# Patient Record
Sex: Female | Born: 1980 | Race: Asian | Hispanic: No | Marital: Single | Smoking: Never smoker
Health system: Southern US, Community
[De-identification: ages and names within clinical notes are randomized; demographics above are authoritative.]

---

## 2004-04-10 ENCOUNTER — Ambulatory Visit: Payer: Self-pay | Admitting: Family Medicine

## 2004-07-16 ENCOUNTER — Other Ambulatory Visit: Admission: RE | Admit: 2004-07-16 | Discharge: 2004-07-16 | Payer: Self-pay | Admitting: Family Medicine

## 2004-07-16 ENCOUNTER — Ambulatory Visit: Payer: Self-pay | Admitting: Family Medicine

## 2004-12-02 ENCOUNTER — Ambulatory Visit: Payer: Self-pay | Admitting: Family Medicine

## 2005-07-02 ENCOUNTER — Encounter: Payer: Self-pay | Admitting: Family Medicine

## 2005-07-02 ENCOUNTER — Ambulatory Visit: Payer: Self-pay | Admitting: Family Medicine

## 2005-07-05 ENCOUNTER — Other Ambulatory Visit: Admission: RE | Admit: 2005-07-05 | Discharge: 2005-07-05 | Payer: Self-pay | Admitting: Family Medicine

## 2006-08-10 ENCOUNTER — Encounter: Payer: Self-pay | Admitting: Family Medicine

## 2006-08-10 ENCOUNTER — Other Ambulatory Visit: Admission: RE | Admit: 2006-08-10 | Discharge: 2006-08-10 | Payer: Self-pay | Admitting: Family Medicine

## 2006-08-10 ENCOUNTER — Ambulatory Visit: Payer: Self-pay | Admitting: Family Medicine

## 2006-08-25 ENCOUNTER — Ambulatory Visit: Payer: Self-pay | Admitting: Family Medicine

## 2006-11-03 ENCOUNTER — Ambulatory Visit: Payer: Self-pay | Admitting: Family Medicine

## 2006-11-21 ENCOUNTER — Ambulatory Visit: Payer: Self-pay | Admitting: Family Medicine

## 2006-11-21 DIAGNOSIS — N760 Acute vaginitis: Secondary | ICD-10-CM | POA: Insufficient documentation

## 2007-02-06 ENCOUNTER — Ambulatory Visit: Payer: Self-pay | Admitting: Family Medicine

## 2007-02-06 DIAGNOSIS — J209 Acute bronchitis, unspecified: Secondary | ICD-10-CM | POA: Insufficient documentation

## 2007-03-23 ENCOUNTER — Ambulatory Visit: Payer: Self-pay | Admitting: Family Medicine

## 2010-06-15 ENCOUNTER — Encounter
Admission: RE | Admit: 2010-06-15 | Discharge: 2010-06-15 | Payer: Self-pay | Source: Home / Self Care | Attending: Family Medicine | Admitting: Family Medicine

## 2010-06-17 ENCOUNTER — Encounter: Payer: Self-pay | Admitting: Family Medicine

## 2011-04-20 ENCOUNTER — Other Ambulatory Visit: Payer: Self-pay | Admitting: Obstetrics and Gynecology

## 2012-04-29 IMAGING — CT CT HEAD W/O CM
2 series · 16 of 30 positions shown, 20 images · non-contrast
Comparison: None.

CLINICAL DATA: Headaches with increasing frequency and severity.
Occasional nausea and vomiting with sound and light sensitivity.

CT HEAD WITHOUT CONTRAST
TECHNIQUE: Contiguous axial images were obtained from the base of
the skull through the vertex without contrast.

[Series 2: head w/o · axial · non-contrast · 0.49mm/px · z∈[+36,+160]mm · 13 of 28 slices shown, 17 images]
[im 2/28  brain]
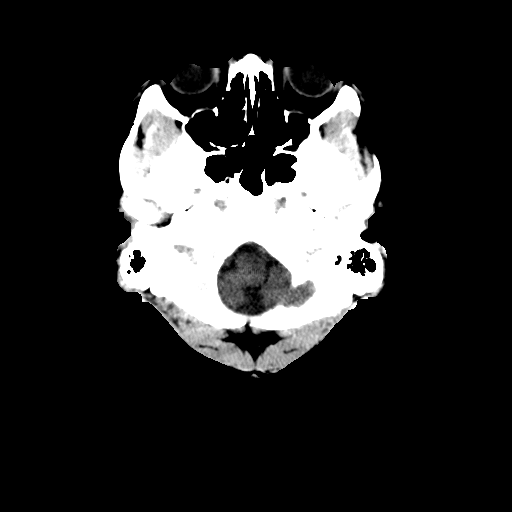
[im 2/28  bone]
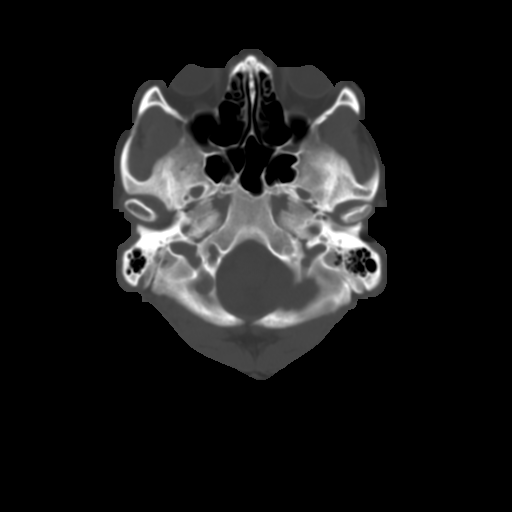
[im 4/28  brain]
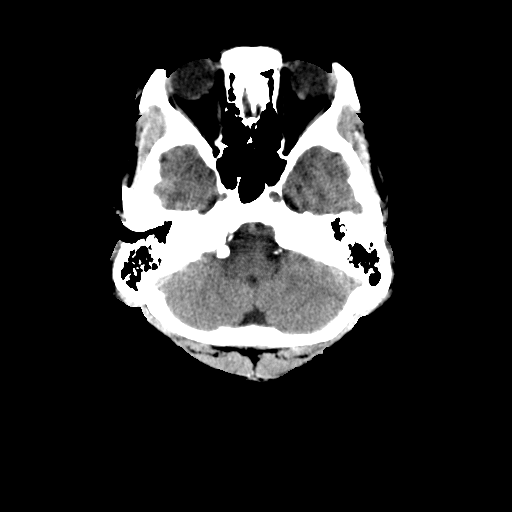
[im 6/28  brain]
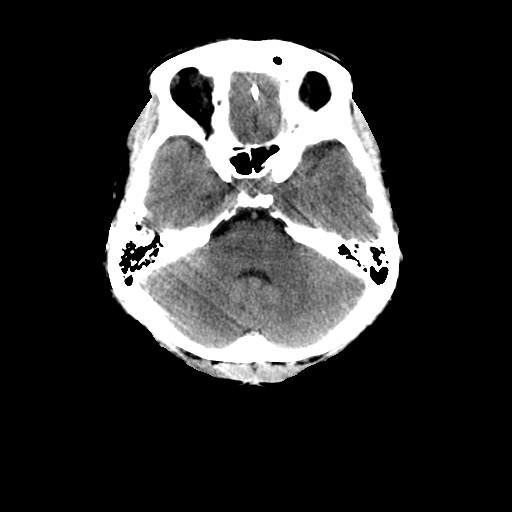
[im 8/28  brain]
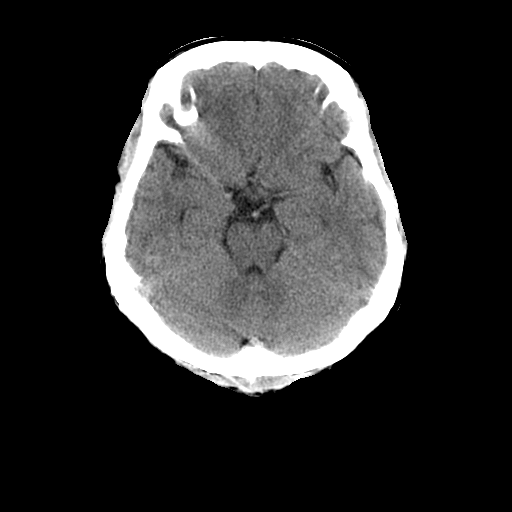
[im 10/28  brain]
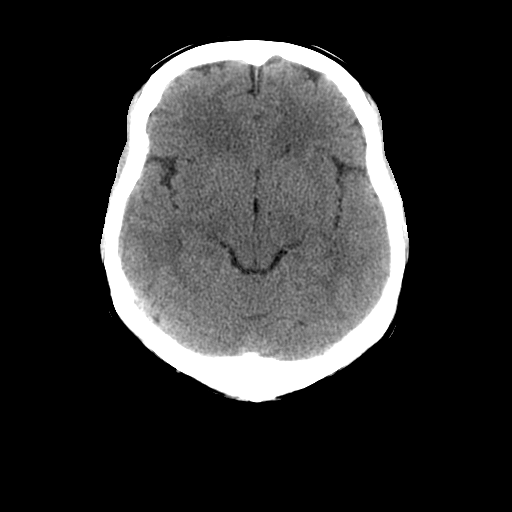
[im 10/28  bone]
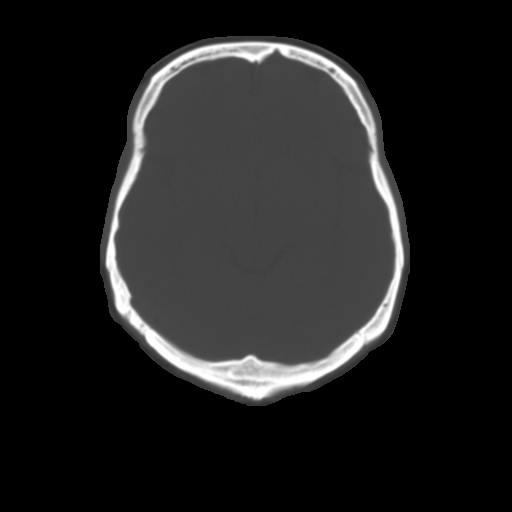
[im 12/28  brain]
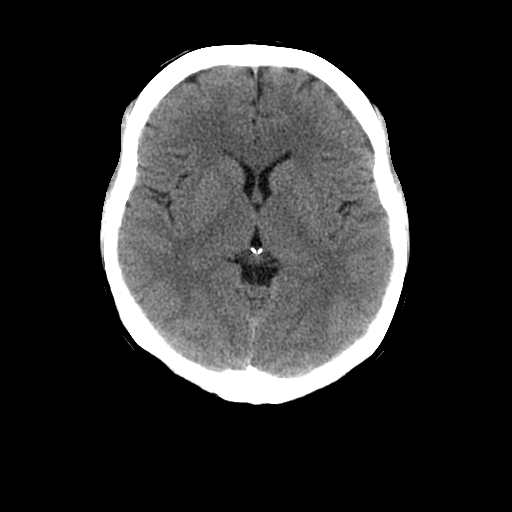
[im 14/28  brain]
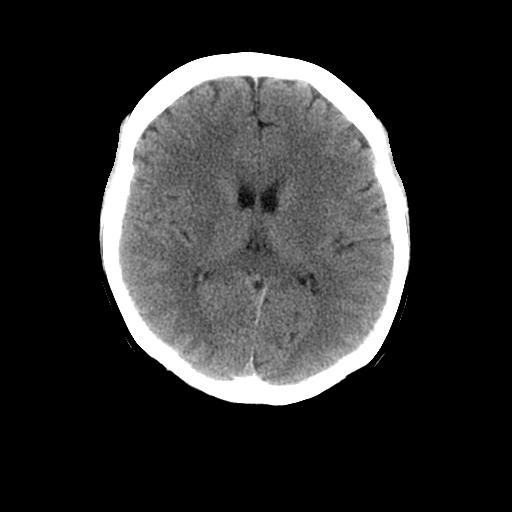
[im 16/28  brain]
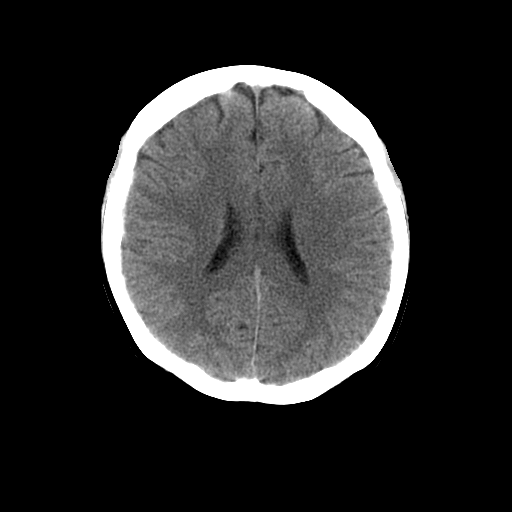
[im 18/28  brain]
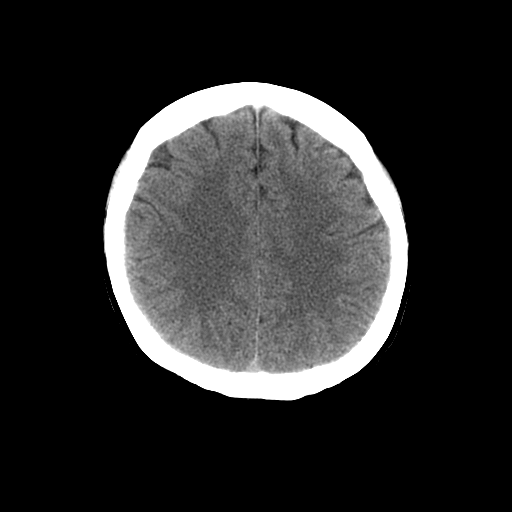
[im 18/28  bone]
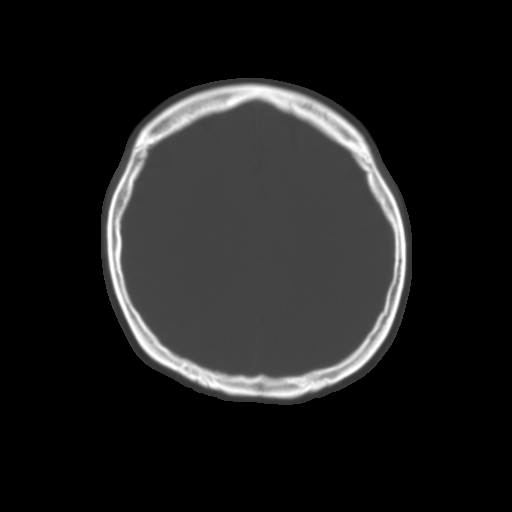
[im 20/28  brain]
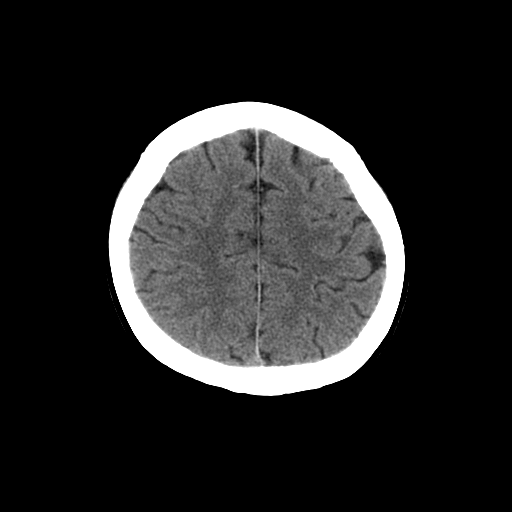
[im 22/28  brain]
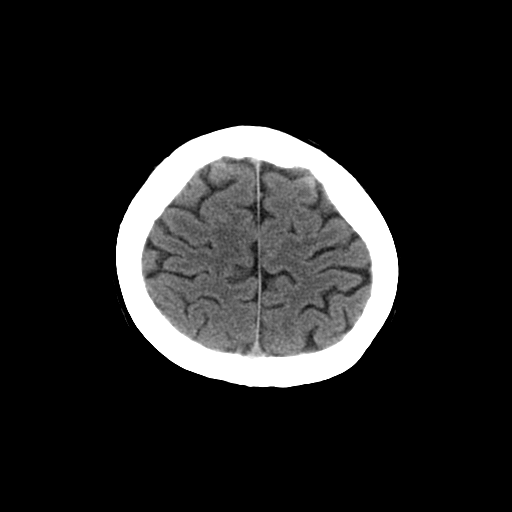
[im 24/28  brain]
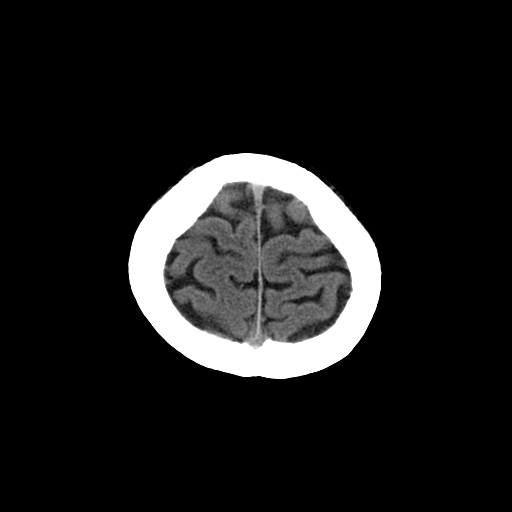
[im 26/28  brain]
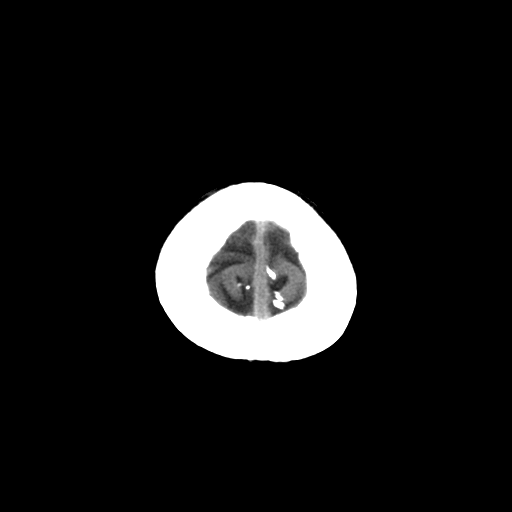
[im 26/28  bone]
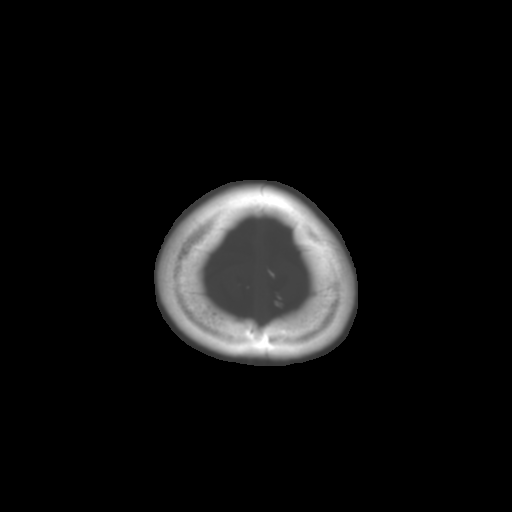

[Series 3: head bone · axial · 0.49mm/px · z∈[+36,+77]mm · 3 of 28 slices shown]
[im 2/28  bone]
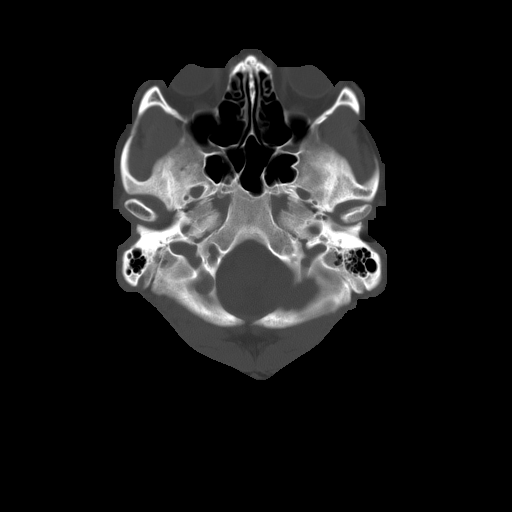
[im 6/28  bone]
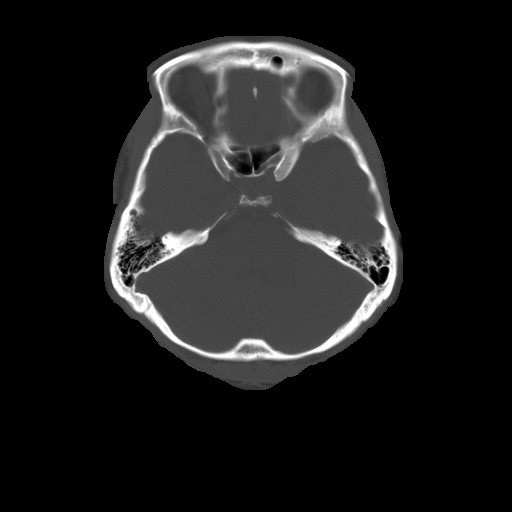
[im 10/28  bone]
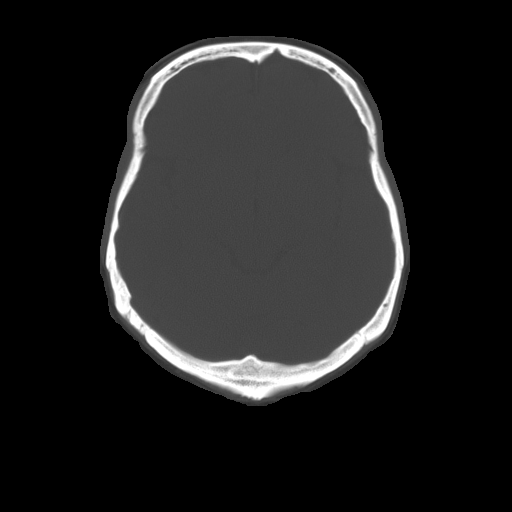

[16 of 30 positions shown; findings below may reference images not displayed]

FINDINGS: No evidence of acute infarct, acute hemorrhage, mass
lesion, mass effect or hydrocephalus.  Visualized portions of the
paranasal sinuses and mastoid air cells are clear.
IMPRESSION: No acute findings.

## 2016-04-20 LAB — OB RESULTS CONSOLE HEPATITIS B SURFACE ANTIGEN: HEP B S AG: NEGATIVE

## 2016-04-20 LAB — OB RESULTS CONSOLE ANTIBODY SCREEN: ANTIBODY SCREEN: NEGATIVE

## 2016-04-20 LAB — OB RESULTS CONSOLE HIV ANTIBODY (ROUTINE TESTING): HIV: NONREACTIVE

## 2016-04-20 LAB — OB RESULTS CONSOLE RPR: RPR: NONREACTIVE

## 2016-04-20 LAB — OB RESULTS CONSOLE GC/CHLAMYDIA
Chlamydia: NEGATIVE
Gonorrhea: NEGATIVE

## 2016-04-20 LAB — OB RESULTS CONSOLE ABO/RH: RH TYPE: POSITIVE

## 2016-04-20 LAB — OB RESULTS CONSOLE RUBELLA ANTIBODY, IGM: Rubella: IMMUNE

## 2016-05-07 ENCOUNTER — Other Ambulatory Visit: Payer: Self-pay | Admitting: Obstetrics and Gynecology

## 2016-05-07 ENCOUNTER — Other Ambulatory Visit (HOSPITAL_COMMUNITY)
Admission: RE | Admit: 2016-05-07 | Discharge: 2016-05-07 | Disposition: A | Discharge status: Home / Self Care | Payer: BLUE CROSS/BLUE SHIELD | Source: Ambulatory Visit | Attending: Obstetrics and Gynecology | Admitting: Obstetrics and Gynecology

## 2016-05-07 DIAGNOSIS — Z113 Encounter for screening for infections with a predominantly sexual mode of transmission: Secondary | ICD-10-CM | POA: Diagnosis present

## 2016-05-07 DIAGNOSIS — Z1151 Encounter for screening for human papillomavirus (HPV): Secondary | ICD-10-CM | POA: Diagnosis present

## 2016-05-07 DIAGNOSIS — Z01419 Encounter for gynecological examination (general) (routine) without abnormal findings: Secondary | ICD-10-CM | POA: Insufficient documentation

## 2016-05-12 LAB — CYTOLOGY - PAP
CHLAMYDIA, DNA PROBE: NEGATIVE
Diagnosis: NEGATIVE
HPV (WINDOPATH): NOT DETECTED
NEISSERIA GONORRHEA: NEGATIVE

## 2016-10-27 LAB — OB RESULTS CONSOLE GBS: GBS: NEGATIVE

## 2016-11-23 ENCOUNTER — Telehealth (HOSPITAL_COMMUNITY): Payer: Self-pay | Admitting: *Deleted

## 2016-11-23 ENCOUNTER — Encounter (HOSPITAL_COMMUNITY): Payer: Self-pay | Admitting: *Deleted

## 2016-11-23 NOTE — Telephone Encounter (Signed)
Preadmission screen  

## 2016-11-27 ENCOUNTER — Other Ambulatory Visit: Payer: Self-pay | Admitting: Obstetrics and Gynecology

## 2016-11-29 ENCOUNTER — Inpatient Hospital Stay (HOSPITAL_COMMUNITY)
Admission: RE | Admit: 2016-11-29 | Discharge: 2016-12-04 | DRG: 766 | Disposition: A | Discharge status: Home / Self Care | Payer: BLUE CROSS/BLUE SHIELD | Source: Ambulatory Visit | Attending: Obstetrics and Gynecology | Admitting: Obstetrics and Gynecology

## 2016-11-29 ENCOUNTER — Encounter (HOSPITAL_COMMUNITY): Payer: Self-pay

## 2016-11-29 VITALS — BP 114/80 | HR 57 | Temp 98.5°F | Resp 16 | Ht 67.0 in | Wt 173.0 lb

## 2016-11-29 DIAGNOSIS — O43123 Velamentous insertion of umbilical cord, third trimester: Secondary | ICD-10-CM | POA: Diagnosis present

## 2016-11-29 DIAGNOSIS — O894 Spinal and epidural anesthesia-induced headache during the puerperium: Secondary | ICD-10-CM | POA: Diagnosis not present

## 2016-11-29 DIAGNOSIS — O480 Post-term pregnancy: Secondary | ICD-10-CM | POA: Diagnosis present

## 2016-11-29 DIAGNOSIS — Z3A40 40 weeks gestation of pregnancy: Secondary | ICD-10-CM | POA: Diagnosis not present

## 2016-11-29 DIAGNOSIS — Z98891 History of uterine scar from previous surgery: Secondary | ICD-10-CM

## 2016-11-29 LAB — CBC
HEMATOCRIT: 36.7 % (ref 36.0–46.0)
HEMOGLOBIN: 12.7 g/dL (ref 12.0–15.0)
MCH: 29.8 pg (ref 26.0–34.0)
MCHC: 34.6 g/dL (ref 30.0–36.0)
MCV: 86.2 fL (ref 78.0–100.0)
Platelets: 192 10*3/uL (ref 150–400)
RBC: 4.26 MIL/uL (ref 3.87–5.11)
RDW: 14.7 % (ref 11.5–15.5)
WBC: 8.7 10*3/uL (ref 4.0–10.5)

## 2016-11-29 LAB — ABO/RH: ABO/RH(D): B POS

## 2016-11-29 LAB — TYPE AND SCREEN
ABO/RH(D): B POS
ANTIBODY SCREEN: NEGATIVE

## 2016-11-29 MED ORDER — SOD CITRATE-CITRIC ACID 500-334 MG/5ML PO SOLN
30.0000 mL | ORAL | Status: DC | PRN
  Administered 2016-11-30: 30 mL via ORAL
  Filled 2016-11-29: qty 15

## 2016-11-29 MED ORDER — BUTORPHANOL TARTRATE 1 MG/ML IJ SOLN
1.0000 mg | INTRAMUSCULAR | Status: DC | PRN
  Administered 2016-11-30: 1 mg via INTRAVENOUS
  Filled 2016-11-29: qty 1

## 2016-11-29 MED ORDER — OXYCODONE-ACETAMINOPHEN 5-325 MG PO TABS: 1.0000 | ORAL_TABLET | ORAL | Status: DC | PRN

## 2016-11-29 MED ORDER — ZOLPIDEM TARTRATE 5 MG PO TABS: 5.0000 mg | ORAL_TABLET | Freq: Every evening | ORAL | Status: DC | PRN

## 2016-11-29 MED ORDER — OXYTOCIN BOLUS FROM INFUSION: 500.0000 mL | Freq: Once | INTRAVENOUS | Status: DC

## 2016-11-29 MED ORDER — MISOPROSTOL 25 MCG QUARTER TABLET
25.0000 ug | ORAL_TABLET | ORAL | Status: DC | PRN
  Administered 2016-11-29 (×4): 25 ug via VAGINAL
  Filled 2016-11-29 (×6): qty 1

## 2016-11-29 MED ORDER — OXYCODONE-ACETAMINOPHEN 5-325 MG PO TABS: 2.0000 | ORAL_TABLET | ORAL | Status: DC | PRN

## 2016-11-29 MED ORDER — LIDOCAINE HCL (PF) 1 % IJ SOLN: 30.0000 mL | INTRAMUSCULAR | Status: DC | PRN

## 2016-11-29 MED ORDER — TERBUTALINE SULFATE 1 MG/ML IJ SOLN: 0.2500 mg | Freq: Once | INTRAMUSCULAR | Status: DC | PRN

## 2016-11-29 MED ORDER — LACTATED RINGERS IV SOLN
INTRAVENOUS | Status: DC
  Administered 2016-11-29 (×2): via INTRAVENOUS
  Administered 2016-11-30: 125 mL/h via INTRAVENOUS
  Administered 2016-11-30: 06:00:00 via INTRAVENOUS

## 2016-11-29 MED ORDER — OXYTOCIN 40 UNITS IN LACTATED RINGERS INFUSION - SIMPLE MED
2.5000 [IU]/h | INTRAVENOUS | Status: DC
  Filled 2016-11-29: qty 1000

## 2016-11-29 MED ORDER — ONDANSETRON HCL 4 MG/2ML IJ SOLN: 4.0000 mg | Freq: Four times a day (QID) | INTRAMUSCULAR | Status: DC | PRN

## 2016-11-29 MED ORDER — LACTATED RINGERS IV SOLN: 500.0000 mL | INTRAVENOUS | Status: DC | PRN

## 2016-11-29 MED ORDER — ACETAMINOPHEN 325 MG PO TABS: 650.0000 mg | ORAL_TABLET | ORAL | Status: DC | PRN

## 2016-11-29 NOTE — Progress Notes (Signed)
Felicia Dawson is a 36 y.o. G1P0 at 4769w4d admitted for induction of labor due to Post dates. Due date 11/25/2016.  Subjective: Patient reports regular contractions more painful. She reports bloody show.   Objective: BP 122/82   Pulse 63   Temp 97.9 F (36.6 C) (Oral)   Resp 16   Ht 5\' 7"  (1.702 m)   Wt 78.5 kg (173 lb)   LMP 02/19/2016   BMI 27.10 kg/m  No intake/output data recorded. No intake/output data recorded.  FHT:  FHR: 140 bpm, variability: moderate,  accelerations:  Present,  decelerations:  Absent UC:   regular, every 2 minutes SVE:   Dilation: 2 Effacement (%): 50 Station: -2 Exam by:: Dr Richardson Doppole  Labs: Lab Results  Component Value Date   WBC 8.7 11/29/2016   HGB 12.7 11/29/2016   HCT 36.7 11/29/2016   MCV 86.2 11/29/2016   PLT 192 11/29/2016    Assessment / Plan: 40 wks and 4 days for induction. S/p cytotec with regular contractions. Pt would like to avoid pitocin if possible. Given that contractions are regular and more painful. Recheck cervix in 4 hour. Plan AROM with fetal descent.   Preeclampsia:  NA Fetal Wellbeing:  Category I Pain Control:  Labor support without medications I/D:  n/a Anticipated MOD:  NSVD  Steven Veazie J. 11/29/2016, 6:28 PM

## 2016-11-29 NOTE — Anesthesia Pain Management Evaluation Note (Signed)
  CRNA Pain Management Visit Note  Patient: Felicia Dawson, 36 y.o., female  "Hello I am a member of the anesthesia team at [facility]. We have an anesthesia team available at all times to provide care throughout the hospital, including epidural management and anesthesia for C-section. I don't know your plan for the delivery whether it a natural birth, water birth, IV sedation, nitrous supplementation, doula or epidural, but we want to meet your pain goals."   1.Was your pain managed to your expectations on prior hospitalizations?   No prior hospitalizations  2.What is your expectation for pain management during this hospitalization?     Epidural  3.How can we help you reach that goal? Natural, then IV pain meds and epidural when progressed further  Record the patient's initial score and the patient's pain goal.   Pain: 0  Pain Goal: 4 [facility] wants you to be able to say your pain was always managed very well.  Felicia Dawson 11/29/2016

## 2016-11-29 NOTE — H&P (Signed)
Felicia Dawson is a 36 y.o. female G1P0 at 40 wks and 4 days  presenting for induction of labor due to postdates. EDD July 04/2017. Pregnancy has been uncomplicated. Prenatal care provided by Dr. Gerald Leitzara Kim Oki with [facility].    OB History    Gravida Para Term Preterm AB Living   1             SAB TAB Ectopic Multiple Live Births                 History reviewed. No pertinent past medical history. History reviewed. No pertinent surgical history. Family History: family history includes Depression in her paternal uncle; Diabetes in her maternal grandmother; Heart disease in her maternal grandmother; Hypertension in her maternal grandmother. Social History:  reports that she has never smoked. She has never used smokeless tobacco. Her alcohol and drug histories are not on file.     Maternal Diabetes: No Genetic Screening: Normal Maternal Ultrasounds/Referrals: Normal Fetal Ultrasounds or other Referrals:  None Maternal Substance Abuse:  No Significant Maternal Medications:  None Significant Maternal Lab Results:  Lab values include: Group B Strep negative Other Comments:  None  Review of Systems  Constitutional: Negative.   HENT: Negative.   Eyes: Negative.   Respiratory: Negative.   Cardiovascular: Negative.   Gastrointestinal: Negative.   Genitourinary: Negative.   Musculoskeletal: Negative.   Skin: Negative.   Neurological: Negative.   Endo/Heme/Allergies: Negative.   Psychiatric/Behavioral: Negative.    History Dilation: Closed Effacement (%): Thick Station: -2 Exam by:: erin davis rn Blood pressure 112/75, pulse 78, temperature 97.9 F (36.6 C), temperature source Oral, resp. rate 20, height 5\' 7"  (1.702 m), weight 78.5 kg (173 lb), last menstrual period 02/19/2016. Maternal Exam:  Uterine Assessment: Contraction strength is mild.  Contraction frequency is irregular.   Abdomen: Patient reports no abdominal tenderness. Introitus: Normal vulva. Normal vagina.  Pelvis:  adequate for delivery.      Fetal Exam Fetal Monitor Review: Baseline rate: 140.  Variability: moderate (6-25 bpm).   Pattern: accelerations present and no decelerations.    Fetal State Assessment: Category I - tracings are normal.     Physical Exam  Vitals reviewed. Constitutional: She is oriented to person, place, and time. She appears well-developed and well-nourished.  HENT:  Head: Normocephalic and atraumatic.  Eyes: Pupils are equal, round, and reactive to light. Conjunctivae are normal.  Neck: Normal range of motion. Neck supple.  Cardiovascular: Normal rate and regular rhythm.   Respiratory: Effort normal and breath sounds normal.  GI: Soft. Bowel sounds are normal. There is no tenderness.  Genitourinary: Vagina normal.  Musculoskeletal: Normal range of motion. She exhibits no edema.  Neurological: She is alert and oriented to person, place, and time. She has normal reflexes.  Skin: Skin is warm and dry.  Psychiatric: She has a normal mood and affect.    Prenatal labs: ABO, Rh: --/--/B POS (07/16 0110) Antibody: NEG (07/16 0110) Rubella: Immune (12/05 0000) RPR: Nonreactive (12/05 0000)  HBsAg: Negative (12/05 0000)  HIV: Non-reactive (12/05 0000)  GBS: Negative (06/13 0000)   Assessment/Plan: 40 wks and 4 days Postdates for induction. Pt informed of increased r/o Cesarean section associated with induction. She voiced understanding and request to proceed with induction.  Cytotec for cervical ripening.    Esparanza Krider J. 11/29/2016, 8:36 AM

## 2016-11-30 ENCOUNTER — Encounter (HOSPITAL_COMMUNITY)
Admission: RE | Disposition: A | Discharge status: Home / Self Care | Payer: Self-pay | Source: Ambulatory Visit | Attending: Obstetrics and Gynecology

## 2016-11-30 ENCOUNTER — Inpatient Hospital Stay (HOSPITAL_COMMUNITY): Payer: BLUE CROSS/BLUE SHIELD | Admitting: Anesthesiology

## 2016-11-30 LAB — RPR: RPR Ser Ql: NONREACTIVE

## 2016-11-30 SURGERY — Surgical Case
Anesthesia: Epidural

## 2016-11-30 MED ORDER — DIPHENHYDRAMINE HCL 25 MG PO CAPS: 25.0000 mg | ORAL_CAPSULE | Freq: Four times a day (QID) | ORAL | Status: DC | PRN

## 2016-11-30 MED ORDER — ZOLPIDEM TARTRATE 5 MG PO TABS: 5.0000 mg | ORAL_TABLET | Freq: Every evening | ORAL | Status: DC | PRN

## 2016-11-30 MED ORDER — CEFAZOLIN SODIUM-DEXTROSE 2-3 GM-% IV SOLR
INTRAVENOUS | Status: DC | PRN
  Administered 2016-11-30: 2 g via INTRAVENOUS

## 2016-11-30 MED ORDER — MENTHOL 3 MG MT LOZG: 1.0000 | LOZENGE | OROMUCOSAL | Status: DC | PRN

## 2016-11-30 MED ORDER — FENTANYL CITRATE (PF) 100 MCG/2ML IJ SOLN
INTRAMUSCULAR | Status: AC
  Filled 2016-11-30: qty 2

## 2016-11-30 MED ORDER — MORPHINE SULFATE (PF) 0.5 MG/ML IJ SOLN
INTRAMUSCULAR | Status: AC
  Filled 2016-11-30: qty 10

## 2016-11-30 MED ORDER — IBUPROFEN 600 MG PO TABS
600.0000 mg | ORAL_TABLET | Freq: Four times a day (QID) | ORAL | Status: DC
  Administered 2016-12-01 – 2016-12-04 (×13): 600 mg via ORAL
  Filled 2016-11-30 (×14): qty 1

## 2016-11-30 MED ORDER — SENNOSIDES-DOCUSATE SODIUM 8.6-50 MG PO TABS
2.0000 | ORAL_TABLET | ORAL | Status: DC
  Administered 2016-12-02 – 2016-12-03 (×3): 2 via ORAL
  Filled 2016-11-30 (×3): qty 2

## 2016-11-30 MED ORDER — TETANUS-DIPHTH-ACELL PERTUSSIS 5-2.5-18.5 LF-MCG/0.5 IM SUSP: 0.5000 mL | Freq: Once | INTRAMUSCULAR | Status: DC

## 2016-11-30 MED ORDER — EPHEDRINE 5 MG/ML INJ: 10.0000 mg | INTRAVENOUS | Status: DC | PRN

## 2016-11-30 MED ORDER — COCONUT OIL OIL: 1.0000 "application " | TOPICAL_OIL | Status: DC | PRN

## 2016-11-30 MED ORDER — DIPHENHYDRAMINE HCL 50 MG/ML IJ SOLN: 12.5000 mg | INTRAMUSCULAR | Status: DC | PRN

## 2016-11-30 MED ORDER — LIDOCAINE HCL (PF) 1 % IJ SOLN
INTRAMUSCULAR | Status: DC | PRN
  Administered 2016-11-30: 1 mL
  Administered 2016-11-30 (×4): .5 mL

## 2016-11-30 MED ORDER — SODIUM CHLORIDE 0.9 % IR SOLN
Status: DC | PRN
  Administered 2016-11-30: 300 mL

## 2016-11-30 MED ORDER — PHENYLEPHRINE HCL 10 MG/ML IJ SOLN
INTRAVENOUS | Status: DC | PRN
  Administered 2016-11-30: 60 ug/min via INTRAVENOUS

## 2016-11-30 MED ORDER — SIMETHICONE 80 MG PO CHEW: 80.0000 mg | CHEWABLE_TABLET | ORAL | Status: DC | PRN

## 2016-11-30 MED ORDER — DIBUCAINE 1 % RE OINT: 1.0000 "application " | TOPICAL_OINTMENT | RECTAL | Status: DC | PRN

## 2016-11-30 MED ORDER — TERBUTALINE SULFATE 1 MG/ML IJ SOLN: 0.2500 mg | Freq: Once | INTRAMUSCULAR | Status: DC | PRN

## 2016-11-30 MED ORDER — ONDANSETRON HCL 4 MG/2ML IJ SOLN
INTRAMUSCULAR | Status: DC | PRN
  Administered 2016-11-30: 4 mg via INTRAVENOUS

## 2016-11-30 MED ORDER — OXYTOCIN 10 UNIT/ML IJ SOLN
INTRAMUSCULAR | Status: AC
  Filled 2016-11-30: qty 4

## 2016-11-30 MED ORDER — ACETAMINOPHEN 325 MG PO TABS
650.0000 mg | ORAL_TABLET | ORAL | Status: DC | PRN
  Administered 2016-12-02: 650 mg via ORAL
  Filled 2016-11-30: qty 2

## 2016-11-30 MED ORDER — LACTATED RINGERS IV SOLN
500.0000 mL | Freq: Once | INTRAVENOUS | Status: AC
  Administered 2016-11-30: 500 mL via INTRAVENOUS

## 2016-11-30 MED ORDER — LACTATED RINGERS IV SOLN
INTRAVENOUS | Status: DC | PRN
  Administered 2016-11-30 (×2): via INTRAVENOUS

## 2016-11-30 MED ORDER — LACTATED RINGERS IV SOLN: INTRAVENOUS | Status: DC

## 2016-11-30 MED ORDER — SCOPOLAMINE 1 MG/3DAYS TD PT72
MEDICATED_PATCH | TRANSDERMAL | Status: AC
  Filled 2016-11-30: qty 1

## 2016-11-30 MED ORDER — PHENYLEPHRINE 8 MG IN D5W 100 ML (0.08MG/ML) PREMIX OPTIME
INJECTION | INTRAVENOUS | Status: AC
  Filled 2016-11-30: qty 100

## 2016-11-30 MED ORDER — PRENATAL MULTIVITAMIN CH
1.0000 | ORAL_TABLET | Freq: Every day | ORAL | Status: DC
  Administered 2016-12-01 – 2016-12-03 (×3): 1 via ORAL
  Filled 2016-11-30 (×3): qty 1

## 2016-11-30 MED ORDER — OXYTOCIN 40 UNITS IN LACTATED RINGERS INFUSION - SIMPLE MED
1.0000 m[IU]/min | INTRAVENOUS | Status: DC
  Administered 2016-11-30: 2 m[IU]/min via INTRAVENOUS

## 2016-11-30 MED ORDER — LACTATED RINGERS IV SOLN
INTRAVENOUS | Status: DC | PRN
  Administered 2016-11-30: 20:00:00 via INTRAVENOUS

## 2016-11-30 MED ORDER — OXYTOCIN 40 UNITS IN LACTATED RINGERS INFUSION - SIMPLE MED
INTRAVENOUS | Status: AC
  Filled 2016-11-30: qty 1000

## 2016-11-30 MED ORDER — SIMETHICONE 80 MG PO CHEW
80.0000 mg | CHEWABLE_TABLET | Freq: Three times a day (TID) | ORAL | Status: DC
  Administered 2016-12-01 – 2016-12-04 (×5): 80 mg via ORAL
  Filled 2016-11-30 (×8): qty 1

## 2016-11-30 MED ORDER — PHENYLEPHRINE 40 MCG/ML (10ML) SYRINGE FOR IV PUSH (FOR BLOOD PRESSURE SUPPORT)
PREFILLED_SYRINGE | INTRAVENOUS | Status: AC
  Filled 2016-11-30: qty 10

## 2016-11-30 MED ORDER — ONDANSETRON HCL 4 MG/2ML IJ SOLN
INTRAMUSCULAR | Status: AC
  Filled 2016-11-30: qty 2

## 2016-11-30 MED ORDER — OXYTOCIN 10 UNIT/ML IJ SOLN
INTRAMUSCULAR | Status: DC | PRN
  Administered 2016-11-30: 40 [IU] via INTRAVENOUS

## 2016-11-30 MED ORDER — FENTANYL 2.5 MCG/ML BUPIVACAINE 1/10 % EPIDURAL INFUSION (WH - ANES)
14.0000 mL/h | INTRAMUSCULAR | Status: DC | PRN
  Administered 2016-11-30: 1.4 mL/h via EPIDURAL
  Filled 2016-11-30: qty 100

## 2016-11-30 MED ORDER — OXYCODONE HCL 5 MG PO TABS
5.0000 mg | ORAL_TABLET | ORAL | Status: DC | PRN
  Administered 2016-12-03: 5 mg via ORAL
  Filled 2016-11-30 (×2): qty 1

## 2016-11-30 MED ORDER — SCOPOLAMINE 1 MG/3DAYS TD PT72
MEDICATED_PATCH | TRANSDERMAL | Status: DC | PRN
  Administered 2016-11-30: 1 via TRANSDERMAL

## 2016-11-30 MED ORDER — CEFAZOLIN SODIUM-DEXTROSE 2-4 GM/100ML-% IV SOLN: 2.0000 g | Freq: Once | INTRAVENOUS | Status: DC

## 2016-11-30 MED ORDER — MORPHINE SULFATE (PF) 0.5 MG/ML IJ SOLN
INTRAMUSCULAR | Status: DC | PRN
  Administered 2016-11-30: .2 mg via INTRATHECAL

## 2016-11-30 MED ORDER — OXYTOCIN 40 UNITS IN LACTATED RINGERS INFUSION - SIMPLE MED: 2.5000 [IU]/h | INTRAVENOUS | Status: AC

## 2016-11-30 MED ORDER — WITCH HAZEL-GLYCERIN EX PADS: 1.0000 "application " | MEDICATED_PAD | CUTANEOUS | Status: DC | PRN

## 2016-11-30 MED ORDER — PHENYLEPHRINE 40 MCG/ML (10ML) SYRINGE FOR IV PUSH (FOR BLOOD PRESSURE SUPPORT)
80.0000 ug | PREFILLED_SYRINGE | INTRAVENOUS | Status: DC | PRN
  Filled 2016-11-30: qty 10

## 2016-11-30 MED ORDER — PHENYLEPHRINE 40 MCG/ML (10ML) SYRINGE FOR IV PUSH (FOR BLOOD PRESSURE SUPPORT): 80.0000 ug | PREFILLED_SYRINGE | INTRAVENOUS | Status: DC | PRN

## 2016-11-30 MED ORDER — BUPIVACAINE IN DEXTROSE 0.75-8.25 % IT SOLN
INTRATHECAL | Status: DC | PRN
  Administered 2016-11-30: 1.5 mL via INTRATHECAL

## 2016-11-30 MED ORDER — SIMETHICONE 80 MG PO CHEW
80.0000 mg | CHEWABLE_TABLET | ORAL | Status: DC
  Administered 2016-12-02 – 2016-12-03 (×3): 80 mg via ORAL
  Filled 2016-11-30 (×3): qty 1

## 2016-11-30 MED ORDER — FENTANYL CITRATE (PF) 100 MCG/2ML IJ SOLN
INTRAMUSCULAR | Status: DC | PRN
  Administered 2016-11-30: 10 ug via INTRATHECAL

## 2016-11-30 MED ORDER — BUPIVACAINE IN DEXTROSE 0.75-8.25 % IT SOLN
INTRATHECAL | Status: AC
  Filled 2016-11-30: qty 2

## 2016-11-30 SURGICAL SUPPLY — 45 items
APL SKNCLS STERI-STRIP NONHPOA (GAUZE/BANDAGES/DRESSINGS) ×1
BARRIER ADHS 3X4 INTERCEED (GAUZE/BANDAGES/DRESSINGS) ×2 IMPLANT
BENZOIN TINCTURE PRP APPL 2/3 (GAUZE/BANDAGES/DRESSINGS) ×3 IMPLANT
BRR ADH 4X3 ABS CNTRL BYND (GAUZE/BANDAGES/DRESSINGS) ×1
CHLORAPREP W/TINT 26ML (MISCELLANEOUS) ×3 IMPLANT
CLAMP CORD UMBIL (MISCELLANEOUS) IMPLANT
CLOSURE WOUND 1/2 X4 (GAUZE/BANDAGES/DRESSINGS) ×1
CLOTH BEACON ORANGE TIMEOUT ST (SAFETY) ×3 IMPLANT
CONTAINER PREFILL 10% NBF 15ML (MISCELLANEOUS) IMPLANT
DRSG OPSITE POSTOP 4X10 (GAUZE/BANDAGES/DRESSINGS) ×3 IMPLANT
ELECT REM PT RETURN 9FT ADLT (ELECTROSURGICAL) ×3
ELECTRODE REM PT RTRN 9FT ADLT (ELECTROSURGICAL) ×1 IMPLANT
EXTRACTOR VACUUM KIWI (MISCELLANEOUS) IMPLANT
GAUZE SPONGE 4X4 12PLY STRL LF (GAUZE/BANDAGES/DRESSINGS) ×4 IMPLANT
GLOVE BIOGEL M 6.5 STRL (GLOVE) ×6 IMPLANT
GLOVE BIOGEL PI IND STRL 6.5 (GLOVE) ×1 IMPLANT
GLOVE BIOGEL PI IND STRL 7.0 (GLOVE) ×1 IMPLANT
GLOVE BIOGEL PI INDICATOR 6.5 (GLOVE) ×2
GLOVE BIOGEL PI INDICATOR 7.0 (GLOVE) ×2
GOWN STRL REUS W/TWL LRG LVL3 (GOWN DISPOSABLE) ×9 IMPLANT
KIT ABG SYR 3ML LUER SLIP (SYRINGE) IMPLANT
NDL HYPO 25X5/8 SAFETYGLIDE (NEEDLE) IMPLANT
NEEDLE HYPO 25X5/8 SAFETYGLIDE (NEEDLE) IMPLANT
NS IRRIG 1000ML POUR BTL (IV SOLUTION) ×3 IMPLANT
PACK C SECTION WH (CUSTOM PROCEDURE TRAY) ×3 IMPLANT
PAD ABD 8X7 1/2 STERILE (GAUZE/BANDAGES/DRESSINGS) ×2 IMPLANT
PAD OB MATERNITY 4.3X12.25 (PERSONAL CARE ITEMS) ×3 IMPLANT
PENCIL SMOKE EVAC W/HOLSTER (ELECTROSURGICAL) ×3 IMPLANT
RTRCTR C-SECT PINK 25CM LRG (MISCELLANEOUS) IMPLANT
STRIP CLOSURE SKIN 1/2X4 (GAUZE/BANDAGES/DRESSINGS) ×2 IMPLANT
SUT PDS AB 0 CT1 27 (SUTURE) ×6 IMPLANT
SUT PLAIN 0 NONE (SUTURE) IMPLANT
SUT PLAIN 2 0 (SUTURE) ×3
SUT PLAIN ABS 2-0 CT1 27XMFL (SUTURE) IMPLANT
SUT VIC AB 0 CT1 27 (SUTURE) ×3
SUT VIC AB 0 CT1 27XBRD ANBCTR (SUTURE) IMPLANT
SUT VIC AB 0 CTX 36 (SUTURE) ×12
SUT VIC AB 0 CTX36XBRD ANBCTRL (SUTURE) ×3 IMPLANT
SUT VIC AB 2-0 CT1 27 (SUTURE) ×3
SUT VIC AB 2-0 CT1 TAPERPNT 27 (SUTURE) ×1 IMPLANT
SUT VIC AB 3-0 SH 27 (SUTURE)
SUT VIC AB 3-0 SH 27X BRD (SUTURE) IMPLANT
SUT VIC AB 4-0 KS 27 (SUTURE) ×3 IMPLANT
TOWEL OR 17X24 6PK STRL BLUE (TOWEL DISPOSABLE) ×3 IMPLANT
TRAY FOLEY BAG SILVER LF 14FR (SET/KITS/TRAYS/PACK) ×3 IMPLANT

## 2016-11-30 NOTE — Progress Notes (Signed)
Felicia Dawson is a 36 y.o. G1P0 at 721w5d  admitted for induction of labor due to Post dates. Due date 11/25/2016.  Subjective: Pt feeling increased pressure in her bottom  Objective: BP 137/80   Pulse 72   Temp 98.6 F (37 C) (Oral)   Resp 18   Ht 5\' 7"  (1.702 m)   Wt 173 lb (78.5 kg)   LMP 02/19/2016   BMI 27.10 kg/m  No intake/output data recorded. No intake/output data recorded.  FHT:  140, moderate variability, +accels, variable decel x 2 with contractions, now resolved -SVE: 6/80/0 to +1 Labs: Lab Results  Component Value Date   WBC 8.7 11/29/2016   HGB 12.7 11/29/2016   HCT 36.7 11/29/2016   MCV 86.2 11/29/2016   PLT 192 11/29/2016    Assessment / Plan: Induction of labor progressing on pitocin   Labor: continue with Pit, plan for IUPC to better assess contractions Preeclampsia:  NA Fetal Wellbeing:  Category II- FHT overall reassuring Pain Control:  Epidural I/D:  n/a Anticipated MOD:  NSVD  Felicia HidalgoJennifer Lenaya Pietsch, DO [phone] (pager) [phone] (office)

## 2016-11-30 NOTE — Progress Notes (Signed)
Felicia Dawson is a 36 y.o. G1P0 at 7382w5d  admitted for induction of labor due to Post dates. Due date 11/25/2016.  Subjective: Anesthesia at bedside to re-dose epidural.  Pt notes slight improvement, but still feeling painful contraction  Objective: BP (!) 145/73   Pulse 78   Temp 98.6 F (37 C) (Oral)   Resp 18   Ht 5\' 7"  (1.702 m)   Wt 173 lb (78.5 kg)   LMP 02/19/2016   BMI 27.10 kg/m  No intake/output data recorded. Total I/O In: -  Out: 1225 [Urine:1225]  FHT:  140, moderate variability, +accels, variable decel x 2 to 100bpm for 1 min with spontaneous recovery, pt repositioned -SVE: 6/80/0- unchange from previously, IUPC and FSE placed Labs: Lab Results  Component Value Date   WBC 8.7 11/29/2016   HGB 12.7 11/29/2016   HCT 36.7 11/29/2016   MCV 86.2 11/29/2016   PLT 192 11/29/2016    Assessment / Plan: Induction of labor progressing on pitocin   Labor: continue with Pit, plan for IUPC to better assess contractions Preeclampsia:  NA Fetal Wellbeing:  Category II- IUPC and FSE placed, pt repositioned.  Plan to continue to closely monitor Pain Control:  Epidural I/D:  n/a  Discussed options with patient- overall FHT reassuring and would advise to continue with plans for vaginal delivery.  IUPC placed to better assess contractions.  Alternative would be primary C-section.  Reviewed all questions and concerns with she and her husband.  Pt wishes to continue with current management plan for vaginal delivery. She is aware that this may change pending FHT and progression of her cervix.  Myna HidalgoJennifer Lorynn Moeser, DO [phone] (pager) [phone] (office)

## 2016-11-30 NOTE — Anesthesia Preprocedure Evaluation (Addendum)
Anesthesia Evaluation  Patient identified by MRN, date of birth, ID band Patient awake    Reviewed: Allergy & Precautions, H&P , NPO status , Patient's Chart, lab work & pertinent test results  Airway Mallampati: II   Neck ROM: full    Dental   Pulmonary neg pulmonary ROS,    breath sounds clear to auscultation       Cardiovascular negative cardio ROS   Rhythm:regular Rate:Normal     Neuro/Psych    GI/Hepatic   Endo/Other    Renal/GU      Musculoskeletal   Abdominal   Peds  Hematology   Anesthesia Other Findings   Reproductive/Obstetrics (+) Pregnancy For c-section 2/2 fetal intolerance to labor. ?functioning epidural/ intrathecal catheter.                            Lab Results  Component Value Date   WBC 8.7 11/29/2016   HGB 12.7 11/29/2016   HCT 36.7 11/29/2016   MCV 86.2 11/29/2016   PLT 192 11/29/2016    Anesthesia Physical Anesthesia Plan  ASA: II  Anesthesia Plan: Spinal   Post-op Pain Management:    Induction: Intravenous  PONV Risk Score and Plan: 3 and Ondansetron, Scopolamine patch - Pre-op and Treatment may vary due to age or medical condition  Airway Management Planned: Natural Airway  Additional Equipment:   Intra-op Plan:   Post-operative Plan:   Informed Consent: I have reviewed the patients History and Physical, chart, labs and discussed the procedure including the risks, benefits and alternatives for the proposed anesthesia with the patient or authorized representative who has indicated his/her understanding and acceptance.     Plan Discussed with: CRNA, Anesthesiologist and Surgeon  Anesthesia Plan Comments: (Questionable epidural/ intrathecal catheter. Plan on SAB in room.)       Anesthesia Quick Evaluation

## 2016-11-30 NOTE — Progress Notes (Signed)
Felicia Dawson is a 36 y.o. G1P0 at 1278w5d  admitted for induction of labor due to Post dates. Due date 11/25/2016.  Subjective: Patient is more comfortable with epidural. No lof +bloody show  Objective: BP 117/78   Pulse 70   Temp 98.6 F (37 C) (Oral)   Resp 16   Ht 5\' 7"  (1.702 m)   Wt 78.5 kg (173 lb)   LMP 02/19/2016   BMI 27.10 kg/m  No intake/output data recorded. No intake/output data recorded.  FHT:  FHR: 140 bpm, variability: moderate,  accelerations:  Present,  decelerations:  Present occasional variable .... overall category 1 tracing UC:   regular, every 2-3 minutes SVE:   Dilation: 6 Effacement (%): 80 Station: +1 Exam by:: Dr. Richardson Doppcole AROM clear fluid.. Cervix was 8 cm prior to AROM after AROM 6.5/ 80/+1 station   Labs: Lab Results  Component Value Date   WBC 8.7 11/29/2016   HGB 12.7 11/29/2016   HCT 36.7 11/29/2016   MCV 86.2 11/29/2016   PLT 192 11/29/2016    Assessment / Plan: Induction of labor progressing on pitocin   Labor: plan to continue  pitocin .Marland Kitchen.  Preeclampsia:  NA Fetal Wellbeing:  Category I Pain Control:  Epidural I/D:  n/a Anticipated MOD:  NSVD  [name] J. 11/30/2016, 2:06 PM

## 2016-11-30 NOTE — Anesthesia Procedure Notes (Signed)
Spinal  Staffing Anesthesiologist: Marcene DuosFITZGERALD, Felicia Dawson Performed: anesthesiologist  Preanesthetic Checklist Completed: patient identified, site marked, surgical consent, pre-op evaluation, timeout performed, IV checked, risks and benefits discussed and monitors and equipment checked Spinal Block Patient position: sitting Prep: site prepped and draped and DuraPrep Patient monitoring: blood pressure, continuous pulse ox and heart rate Approach: midline Location: L4-5 Injection technique: single-shot Needle Needle type: Pencan  Needle gauge: 24 G Needle length: 9 cm Needle insertion depth: 6 cm Assessment Sensory level: T4 Events: high spinal Additional Notes Epidural catheter removed tip intact. Aspiration of CSF before during and after injection LA. Pt tolerated procedure however,pt complained of difficulty breathing and hand grip strength diminished 10min after injection. Mask placed on pt and pt placed in reverse trendelenburg position. After 10 min of positioning tidal volumes with unassisted ventilation improved. Sats remained >96% throughout. Pt stated much improvement after 20 min.

## 2016-11-30 NOTE — Progress Notes (Signed)
At bedside to discuss management.  Another variable decel was noted with contraction with spontaneous recovery.  At this point, family wishes to reviewed options.  Risk benefits and alternatives of cesarean section were discussed with the patient including but not limited to infection, bleeding, damage to bowel , bladder and baby with the need for further surgery. Alternatives were also addressed.  Pt voiced understanding and desires to proceed with primary C-section due to fetal intolerance to labor.  Myna HidalgoJennifer Brigetta Beckstrom, DO [phone] (pager) [phone] (office)

## 2016-11-30 NOTE — Anesthesia Procedure Notes (Signed)
Epidural Patient location during procedure: OB Start time: 11/30/2016 7:45 AM End time: 11/30/2016 8:06 AM  Staffing Anesthesiologist: Chaney MallingHODIERNE, Viraaj Vorndran Performed: anesthesiologist   Preanesthetic Checklist Completed: patient identified, site marked, pre-op evaluation, timeout performed, IV checked, risks and benefits discussed and monitors and equipment checked  Epidural Patient position: sitting Prep: DuraPrep Patient monitoring: heart rate, cardiac monitor, continuous pulse ox and blood pressure Approach: midline Location: L3-L4 Injection technique: LOR air  Needle:  Needle type: Tuohy  Needle gauge: 17 G Needle length: 9 cm Needle insertion depth: 6 cm Catheter type: closed end flexible Catheter size: 19 Gauge Catheter at skin depth: 11 cm Test dose: negative and Other  Assessment Events: blood not aspirated, injection not painful, no injection resistance and negative IV test  Additional Notes Informed consent obtained prior to proceeding including risk of failure, 1% risk of PDPH, risk of minor discomfort and bruising.  Discussed rare but serious complications including epidural abscess, permanent nerve injury, epidural hematoma.  Discussed alternatives to epidural analgesia and patient desires to proceed.  Timeout performed pre-procedure verifying patient name, procedure, and platelet count.  Patient tolerated procedure well.  Catheter was placed into intrathecal space.  Implications of this were discussed with the patient and her nurse (including headache). Reason for block:procedure for pain

## 2016-11-30 NOTE — Transfer of Care (Signed)
Immediate Anesthesia Transfer of Care Note  Patient: Felicia Dawson  Procedure(s) Performed: Procedure(s): CESAREAN SECTION (N/A)  Patient Location: PACU  Anesthesia Type:Spinal  Level of Consciousness: awake, alert  and oriented  Airway & Oxygen Therapy: Patient Spontanous Breathing  Post-op Assessment: Report given to RN and Post -op Vital signs reviewed and stable  Post vital signs: Reviewed and stable  Last Vitals:  Vitals:   11/30/16 1901 11/30/16 1930  BP: 108/67 122/62  Pulse: 84 77  Resp: 16 20  Temp:      Last Pain:  Vitals:   11/30/16 1859  TempSrc:   PainSc: 7          Complications: No apparent anesthesia complications

## 2016-11-30 NOTE — Progress Notes (Addendum)
Felicia Dawson is a 36 y.o. G1P0 at 3656w5d  admitted for induction of labor due to Post dates. Due date 11/25/2016.  Subjective: Patient is feeling pain and pressure during contractions. No lof + Bloody show  Objective: BP 117/78   Pulse 70   Temp 98.6 F (37 C) (Oral)   Resp 16   Ht 5\' 7"  (1.702 m)   Wt 78.5 kg (173 lb)   LMP 02/19/2016   BMI 27.10 kg/m  No intake/output data recorded. No intake/output data recorded.  FHT:  FHR: 140 bpm, variability: moderate,  accelerations:  Present,  decelerations:  Absent UC:   regular, every 2-3 minutes SVE:   Dilation: 8 Effacement (%): 80 Station: +1 Exam by:: Dr. Richardson Doppole  Labs: Lab Results  Component Value Date   WBC 8.7 11/29/2016   HGB 12.7 11/29/2016   HCT 36.7 11/29/2016   MCV 86.2 11/29/2016   PLT 192 11/29/2016    Assessment / Plan: Induction of labor due to postterm,  progressing well on pitocin  Labor: Progressing on Pitocin, will continue to increase then AROM Preeclampsia:  NA Fetal Wellbeing:  Category I Pain Control:  Epidural I/D:  n/a Anticipated MOD:  NSVD  Felicia Dawson J. 11/30/2016, 2:04 PM

## 2016-11-30 NOTE — Anesthesia Postprocedure Evaluation (Signed)
Anesthesia Post Note  Patient: Felicia Dawson  Procedure(s) Performed: Procedure(s) (LRB): CESAREAN SECTION (N/A)     Patient location during evaluation: PACU Anesthesia Type: Spinal Level of consciousness: awake and alert Pain management: pain level controlled Vital Signs Assessment: post-procedure vital signs reviewed and stable Respiratory status: spontaneous breathing and respiratory function stable Cardiovascular status: blood pressure returned to baseline and stable Postop Assessment: spinal receding Anesthetic complications: no    Last Vitals:  Vitals:   11/30/16 2202 11/30/16 2203  BP:    Pulse: 71 72  Resp: 13 17  Temp:      Last Pain:  Vitals:   11/30/16 2201  TempSrc: Oral  PainSc:    Pain Goal:                 Kennieth RadFitzgerald, Kelseigh Diver E

## 2016-11-30 NOTE — Op Note (Signed)
PreOp Diagnosis: 1) Intrauterine pregnancy @ 4840w5d 2) Fetal intolerance to labor PostOp Diagnosis: Same and OP presentation Procedure: Primary C-section Surgeon: Dr. Myna HidalgoJennifer Edsel Shives Assistant: Gerrit HeckJessica Emly, CNM Anesthesia: spinal Complications: none EBL: 800cc UOP: 150cc Fluids: 2800cc  Findings: Female infant from OP presentation with tight nuchal cord.  Normal uterus, tubes and ovaries bilaterally.  Marginal cord insertion of placenta.  PROCEDURE:  Informed consent was obtained from the patient with risks, benefits, complications, treatment options, and expected outcomes discussed with the patient.  The patient concurred with the proposed plan, giving informed consent with form signed.   The patient was taken to Operating Room, and identified with the procedure verified as C-Section Delivery with Time Out. With induction of anesthesia, the patient was prepped and draped in the usual sterile fashion. A Pfannenstiel incision was made and carried down through the subcutaneous tissue to the fascia. The fascia was incised in the midline and extended transversely. The superior aspect of the fascial incision was grasped with Kochers elevated and the underlying muscle dissected off. The inferior aspect of the facial incision was in similar fashion, grasped elevated and rectus muscles dissected off. The peritoneum was identified and entered. Peritoneal incision was extended longitudinally. The Alexis retractor was placed.  The utero-vesical peritoneal reflection was identified and incised transversely with the [facility] scissors, the incision extended laterally, the bladder flap created digitally. A low transverse uterine incision was made and the infants head delivered atraumatically. After the umbilical cord was clamped and cut cord blood was obtained for evaluation.   The placenta was removed intact and appeared normal. The uterine outline, tubes and ovaries appeared normal. The uterine incision was closed with  running locked sutures of 0 Vicryl and a second layer of the same stitch was used in an imbricating fashion.  Excellent hemostasis was obtained.  The pericolic gutters were then cleared of all clots and debris. Interceed was placed over the incision.  The Alexis retractor was removed.  The peritoneum was closed in a running fashion. The fascia was then reapproximated with running sutures of 0 Vicryl. The subcutaneous tissue was reapproximated with 2-0 plain gut suture.  The skin was closed with 4-0 vicryl in a subcuticular fashion.  Instrument, sponge, and needle counts were correct prior the abdominal closure and at the conclusion of the case. The patient was taken to recovery in stable condition.  Myna HidalgoJennifer Shahan Starks, DO [phone]) [phone] (office)

## 2016-12-01 ENCOUNTER — Encounter (HOSPITAL_COMMUNITY): Payer: Self-pay | Admitting: Obstetrics & Gynecology

## 2016-12-01 LAB — CBC
HCT: 33.3 % — ABNORMAL LOW (ref 36.0–46.0)
Hemoglobin: 11.4 g/dL — ABNORMAL LOW (ref 12.0–15.0)
MCH: 29.5 pg (ref 26.0–34.0)
MCHC: 34.2 g/dL (ref 30.0–36.0)
MCV: 86 fL (ref 78.0–100.0)
PLATELETS: 176 10*3/uL (ref 150–400)
RBC: 3.87 MIL/uL (ref 3.87–5.11)
RDW: 14.8 % (ref 11.5–15.5)
WBC: 22.4 10*3/uL — AB (ref 4.0–10.5)

## 2016-12-01 NOTE — Anesthesia Postprocedure Evaluation (Signed)
Anesthesia Post Note  Patient: Felicia Dawson  Procedure(s) Performed: Procedure(s) (LRB): CESAREAN SECTION (N/A)     Patient location during evaluation: Mother Baby Anesthesia Type: Epidural Level of consciousness: awake Pain management: satisfactory to patient Vital Signs Assessment: post-procedure vital signs reviewed and stable Respiratory status: spontaneous breathing Cardiovascular status: stable Anesthetic complications: no    Last Vitals:  Vitals:   12/01/16 0145 12/01/16 0545  BP: 110/61   Pulse: 85   Resp: 20   Temp: 37.5 C 36.8 C    Last Pain:  Vitals:   12/01/16 0545  TempSrc: Oral  PainSc:    Pain Goal:                 KeyCorpBURGER,Malesha Suliman

## 2016-12-01 NOTE — Progress Notes (Signed)
Subjective: Postpartum Day 1: Cesarean Delivery Patient reports tolerating PO and + flatus.  Foley still in place.. Pain is well controlled.   Objective: Vital signs in last 24 hours: Temp:  [97 F (36.1 C)-99.5 F (37.5 C)] 98.7 F (37.1 C) (07/18 1000) Pulse Rate:  [64-102] 68 (07/18 1000) Resp:  [11-29] 16 (07/18 1000) BP: (61-147)/(49-86) 97/55 (07/18 1000) SpO2:  [97 %-100 %] 98 % (07/18 1000)  Physical Exam:  General: alert, cooperative and no distress Lochia: appropriate Uterine Fundus: firm Incision: healing well DVT Evaluation: No evidence of DVT seen on physical exam.   Recent Labs  11/29/16 0049 12/01/16 0539  HGB 12.7 11.4*  HCT 36.7 33.3*    Assessment/Plan: Status post Cesarean section. Doing well postoperatively.  Continue current care Remove foley later today.  Encourage ambulation in halls tid Pt desires newborn circumcision. R/B/A discussed .  Felicia Dawson J. 12/01/2016, 12:49 PM

## 2016-12-01 NOTE — Plan of Care (Signed)
Problem: Education: Goal: Knowledge of condition will improve Upon introducing self this morning, patient and significant other request limited disturbances today. They have gotten little rest since labor. Discussed plan for today and what time assessments are scheduled. Discussed clustering care with scheduled medication and removing foley catheter. Encouraged patient to call out when assistance needed.

## 2016-12-01 NOTE — Addendum Note (Signed)
Addendum  created 12/01/16 0845 by Algis GreenhouseBurger, Annaleah Arata A, CRNA   Charge Capture section accepted, Sign clinical note

## 2016-12-01 NOTE — Lactation Note (Signed)
This note was copied from a baby's chart. Lactation Consultation Note  Patient Name: Felicia Dawson Reason for consult: Initial assessment;Other (Comment) (left nipple inverted and baby sleepy since circumcision.)  Baby 19 hours old. Mom reports that she was seeing colostrum flowing this morning, but now isn't seeing as much. Mom states that baby ate earlier this morning, but did not nurse before being circumcised. Mom reports that she is not able to latch baby to left breast d/t inverted nipple. Offered to assist with latching baby now to left breast but mom declined stating that she does not want to wake the baby at this time to latch. Assisted mom with hand expression, and mom able to hand express with a few drops flowing bilaterally--which were given to baby with spoon and this LC's gloved finger. Mom enc to use manual pump to stimulate breasts, and then enc to hold baby STS. Enc mom to latch with cues, then hand express and give baby drops with finger, spoon and or curve-tipped syringe. Mom given supplementation guidelines and discussed progression of milk coming to volume and supply and demand.   Discussed infant behavior while transitioning and after circumcision and enc mom to continue latching and hand expressing and feeding baby EBM. Enc mom to start using DEBP if baby not latching over the next few hours/attempted feeds. Enc mom to call for assistance with latching as well. Discussed assessment and interventions with Felicia Phieven, RN.    Maternal Data Has patient been taught Hand Expression?: Yes Does the patient have breastfeeding experience prior to this delivery?: No  Feeding    LATCH Score/Interventions                      Lactation Tools Discussed/Used Tools: Pump Breast pump type: Manual Pump Review: Setup, frequency, and cleaning;Milk Storage Initiated by:: bedside RN Date initiated:: 12/01/16   Consult Status Consult Status:  Follow-up Date: 12/02/16 Follow-up type: In-patient    Sherlyn HayJennifer D Pope Brunty Dawson, 3:44 PM

## 2016-12-02 ENCOUNTER — Inpatient Hospital Stay (HOSPITAL_COMMUNITY): Payer: BLUE CROSS/BLUE SHIELD

## 2016-12-02 MED ORDER — ONDANSETRON 8 MG PO TBDP
8.0000 mg | ORAL_TABLET | Freq: Three times a day (TID) | ORAL | Status: DC | PRN
  Administered 2016-12-02: 8 mg via ORAL
  Filled 2016-12-02 (×2): qty 1

## 2016-12-02 NOTE — Lactation Note (Signed)
This note was copied from a baby's chart. Lactation Consultation Note New mom has Lt. Inverted nipple and Rt. Everted nipple. Mom has small breast. Most breast tissue to outter aspects of breast up to axillary. Noted puffiness up axillary. FOB stated he hadn't been that way.  Mom stated baby couldn't BF on Lt. Breast but had no trouble to Rt. Breast.  Fitted mom w/#24 NS, baby latched, but didn't appear to be that comfortable, ? To large. Fitted mom #20 NS. Noted colostrum in both NS, more in #20 NS. Gave mom shells to wear in bra. Encouraged to pre-pump w/hand pump to pull inverted nipple to define boarders of nipple to apply NS. Taught application of NS.  D/t small breast set up DEBP. Mom shown how to use DEBP & how to disassemble, clean, & reassemble parts. Encouraged to pump 5-6 times a day. Baby cluster feeding at this time.  Placed baby to Rt. Breast. Baby cried noted mid tight frenulum. Baby able to lift tongue mid way to roof of mouth, front of tongue curling back as well as sides of tongue curling inwards.  FOB stated when he was little he had to have his tongue clipped. Discussed transfer of colostrum and BM. Monitoring I&O, encouraged to feel breast before and after BF.   Mom having a lot of pain in her neck and shoulders. Discussed air getting trapped w/surgery. RN brought heating pad for comfort. Mom laid backwards to aide in comfort for shoulder and neck pain.   Patient Name: Felicia Dawson CircleMian Wale RUEAV'WToday's Date: 12/02/2016 Reason for consult: Follow-up assessment   Maternal Data    Feeding Feeding Type: Breast Fed Length of feed: 35 min  LATCH Score/Interventions Latch: Grasps breast easily, tongue down, lips flanged, rhythmical sucking. Intervention(s): Adjust position;Assist with latch;Breast massage;Breast compression  Audible Swallowing: A few with stimulation Intervention(s): Skin to skin;Hand expression;Alternate breast massage  Type of Nipple: Inverted Intervention(s):  Shells;Hand pump;Double electric pump  Comfort (Breast/Nipple): Soft / non-tender     Hold (Positioning): Full assist, staff holds infant at breast Intervention(s): Breastfeeding basics reviewed;Support Pillows;Position options;Skin to skin  LATCH Score: 5  Lactation Tools Discussed/Used Tools: Shells;Nipple Dorris CarnesShields;Pump Nipple shield size: 20;24 Shell Type: Inverted Breast pump type: Manual WIC Program: No Initiated by:: Peri JeffersonL. Katianne Barre RN IBCLC Date initiated:: 12/02/16   Consult Status Consult Status: Follow-up Date: 12/02/16 (in pm) Follow-up type: In-patient    Charyl DancerCARVER, Artemio Dobie G 12/02/2016, 2:38 AM

## 2016-12-02 NOTE — Progress Notes (Signed)
Call placed to Dr. Estanislado Pandyivard regarding nausea. Verbal order received for Zofran ODT q8hr prn for nausea.Will continue to monitor.

## 2016-12-02 NOTE — Progress Notes (Signed)
Subjective: Postpartum Day 2: Cesarean Delivery Pt resting comfortably in bed, pain well controlled.  No F/C/CP/SOB.  Tolerating gen diet.  +flatus, no BM.  Ambulating and voiding without difficulty.  Objective: Vital signs in last 24 hours: Temp:  [98.2 F (36.8 C)-98.9 F (37.2 C)] 98.2 F (36.8 C) (07/19 0619) Pulse Rate:  [61-88] 61 (07/19 0619) Resp:  [16-20] 17 (07/19 0619) BP: (97-124)/(55-78) 118/71 (07/19 0619) SpO2:  [97 %-99 %] 99 % (07/18 2115)  Physical Exam:  General: alert, cooperative and no distress  CV: RRR Lungs: CTAB Abd: +BS, soft, mild distension, no rebound, no guarding Lochia: appropriate Uterine Fundus: firm, below umbilicus, non-tender Incision: clean and dry, honeycomb in place DVT Evaluation: 2+ edema bilaterally, No evidence of DVT seen on physical exam.  CBC Latest Ref Rng & Units 12/01/2016 11/29/2016  WBC 4.0 - 10.5 K/uL 22.4(H) 8.7  Hemoglobin 12.0 - 15.0 g/dL 11.4(L) 12.7  Hematocrit 36.0 - 46.0 % 33.3(L) 36.7  Platelets 150 - 400 K/uL 176 192    Assessment/Plan: 36yo G1P1001 s/p primary C-section, POD #2  Doing well postoperatively.  Continue current care Encourage ambulation in halls tid Baby boy circ completed by Dr. Richardson Doppole  Continue routine post op care, plan for discharge home tomorrow  Myna HidalgoOZAN, Yamila Cragin, M 12/02/2016, 6:45 AM

## 2016-12-03 ENCOUNTER — Inpatient Hospital Stay (HOSPITAL_COMMUNITY): Payer: BLUE CROSS/BLUE SHIELD | Admitting: Anesthesiology

## 2016-12-03 MED ORDER — LACTATED RINGERS IV SOLN
Freq: Once | INTRAVENOUS | Status: AC
  Administered 2016-12-03: 10:00:00 via INTRAVENOUS

## 2016-12-03 MED ORDER — EPHEDRINE 5 MG/ML INJ
10.0000 mg | INTRAVENOUS | Status: DC | PRN
  Filled 2016-12-03: qty 2

## 2016-12-03 MED ORDER — MIDAZOLAM HCL 2 MG/2ML IJ SOLN
1.0000 mg | Freq: Once | INTRAMUSCULAR | Status: AC
  Administered 2016-12-03: 1 mg via INTRAVENOUS

## 2016-12-03 MED ORDER — LACTATED RINGERS IV SOLN: 500.0000 mL | Freq: Once | INTRAVENOUS | Status: DC

## 2016-12-03 MED ORDER — FENTANYL CITRATE (PF) 100 MCG/2ML IJ SOLN
100.0000 ug | Freq: Once | INTRAMUSCULAR | Status: AC
  Administered 2016-12-03: 100 ug via INTRAVENOUS

## 2016-12-03 MED ORDER — PROMETHAZINE HCL 25 MG PO TABS: 12.5000 mg | ORAL_TABLET | Freq: Four times a day (QID) | ORAL | Status: DC | PRN

## 2016-12-03 MED ORDER — IBUPROFEN 600 MG PO TABS: 600.0000 mg | ORAL_TABLET | Freq: Four times a day (QID) | ORAL | 1 refills | Status: AC | PRN

## 2016-12-03 MED ORDER — DIPHENHYDRAMINE HCL 50 MG/ML IJ SOLN: 12.5000 mg | INTRAMUSCULAR | Status: DC | PRN

## 2016-12-03 MED ORDER — PHENYLEPHRINE 40 MCG/ML (10ML) SYRINGE FOR IV PUSH (FOR BLOOD PRESSURE SUPPORT)
80.0000 ug | PREFILLED_SYRINGE | INTRAVENOUS | Status: DC | PRN
  Filled 2016-12-03: qty 5

## 2016-12-03 MED ORDER — OXYCODONE HCL 5 MG PO TABS: 5.0000 mg | ORAL_TABLET | ORAL | 0 refills | Status: AC | PRN

## 2016-12-03 MED ORDER — FENTANYL CITRATE (PF) 100 MCG/2ML IJ SOLN
INTRAMUSCULAR | Status: AC
  Administered 2016-12-03: 100 ug via INTRAVENOUS
  Filled 2016-12-03: qty 2

## 2016-12-03 NOTE — Progress Notes (Signed)
Subjective: Postpartum Day 3: Cesarean Delivery Patient reports  Headache and neck pain that developed yesterday. The pain is worse with change in positions.. She has been evaluated by anesthesiology and blood patch is scheduled for today .    Objective: Vital signs in last 24 hours: Temp:  [98 F (36.7 C)-99.2 F (37.3 C)] 98 F (36.7 C) (07/20 1205) Pulse Rate:  [49-61] 52 (07/20 1205) Resp:  [10-21] 18 (07/20 1205) BP: (122-145)/(70-93) 126/72 (07/20 1205) SpO2:  [94 %-100 %] 99 % (07/20 1205)  Physical Exam:  General: alert and cooperative Lochia: appropriate Uterine Fundus: firm Incision: healing well DVT Evaluation: No evidence of DVT seen on physical exam.   Recent Labs  12/01/16 0539  HGB 11.4*  HCT 33.3*    Assessment/Plan: Status post Cesarean section. Doing well postoperatively. Spinal Headache_ blood patch scheduled for today.Marland Kitchen. if pt improves after procedure consider d/c home.. May observe overnight and discharge in am  CCOB covering after 5 pm today Follow up in 2 wks for incision check .  Yael Angerer J. 12/03/2016, 3:07 PM

## 2016-12-03 NOTE — OR Nursing (Signed)
Returned to room post Blood patch procedure. Denies pain. Discharge instructions with patient and instructed. Matilde BashN Chesney Klimaszewski, RN

## 2016-12-03 NOTE — Anesthesia Preprocedure Evaluation (Signed)
Anesthesia Evaluation  Patient identified by MRN, date of birth, ID band Patient awake    Reviewed: Allergy & Precautions, H&P , NPO status , Patient's Chart, lab work & pertinent test results  Airway Mallampati: II   Neck ROM: full    Dental no notable dental hx.    Pulmonary neg pulmonary ROS,    breath sounds clear to auscultation       Cardiovascular negative cardio ROS   Rhythm:regular Rate:Normal     Neuro/Psych  Headaches, negative psych ROS   GI/Hepatic negative GI ROS, Neg liver ROS,   Endo/Other  negative endocrine ROS  Renal/GU negative Renal ROS     Musculoskeletal   Abdominal   Peds  Hematology negative hematology ROS (+)   Anesthesia Other Findings   Reproductive/Obstetrics                             Lab Results  Component Value Date   WBC 22.4 (H) 12/01/2016   HGB 11.4 (L) 12/01/2016   HCT 33.3 (L) 12/01/2016   MCV 86.0 12/01/2016   PLT 176 12/01/2016    Anesthesia Physical  Anesthesia Plan  ASA: II  Anesthesia Plan: Epidural   Post-op Pain Management:    Induction:   PONV Risk Score and Plan:   Airway Management Planned: Natural Airway  Additional Equipment:   Intra-op Plan:   Post-operative Plan:   Informed Consent: I have reviewed the patients History and Physical, chart, labs and discussed the procedure including the risks, benefits and alternatives for the proposed anesthesia with the patient or authorized representative who has indicated his/her understanding and acceptance.   Dental advisory given  Plan Discussed with: CRNA, Anesthesiologist and Surgeon  Anesthesia Plan Comments: (Persistent posttural headache worsening over past 24 hours.)        Anesthesia Quick Evaluation

## 2016-12-03 NOTE — Lactation Note (Addendum)
This note was copied from a baby's chart. Lactation Consultation Note Mom sick throwing up w/neck pain and some headache. Baby is cluster feeding constantly. Mom and FOB exhausted and feeling as if baby isn't getting enough and hungry. Mom states seeing some colostrum in NS. At this point of parents upset w/worry and requesting formula, Lc fed baby Alimentum. Baby was aggressive, LC listening to Nurse talk to mom and baby took 30 ml.  Discussed options for supplementing. Discussed SNS after mom is feeling better, mom agreed.  Mom has pumped several times today w/nothing coming out. Mom's breast feeling full. Mom in no shape at this time to hand express or pump breast. Mom needs to rest. Midwife came into see mom, feels that mom may have spinal headache.  Lc listened to concerns and reassured parents of baby getting something d/t output. LC gave support to parents.  Asked FOB to call LC for next feeding to assess for how and if supplementing needed.  Patient Name: Felicia Dawson Lacross ZOXWR'UToday's Date: 12/03/2016 Reason for consult: Follow-up assessment   Maternal Data    Feeding Feeding Type: Formula Nipple Type: Slow - flow  LATCH Score/Interventions                      Lactation Tools Discussed/Used     Consult Status Consult Status: Follow-up Date: 12/03/16 Follow-up type: In-patient    [name], Diamond NickelLAURA G 12/03/2016, 1:31 AM

## 2016-12-03 NOTE — Lactation Note (Signed)
This note was copied from a baby's chart. Lactation Consultation Note Checked on mom d/t had asked to call for assistance for next feeding for different options of supplementing w/o giving bottle. Mentions SNS and left in rm. FOB stated he tried to set it up but the baby didn't get anything out of it. Tech. Was finger finger w/curve tip syring. LC offered to set up SNS, parents welcomed the idea. LC had baby in football position to Lt. Breast in NS to everted nipple. Baby Bf for about 5 min. Then didn't act interested. Allowed baby to rest beside mom STS. Mom laying supine d/t HA. Baby starting to root, knocking NS off. Assisted mom and baby to Rt. Breast w/o NS in side lying position then fed SNS to baby. Baby was relaxed and fed great. Mom rested as well. MD and RN talking to mom about blood patch.  FOB concerned about mom being sick and about baby being fussy all night. Asked FOB why he didn't call for assistance, he stated he was tired and didn't feel that there was anything that could be done. Had offered to take baby to CN d/t parents exhausted. FIB stated he thought about it but didn't want to. LC stated understand, it is preferred baby to stay w/parents d/t mom sick and FOB so tired and worried needing to sleep, it was offered.  FOB was frustrated during the night d/t mom being sick, baby crying and couldn't figure out what baby needed wanting to feed constantly. FOB felt that he was working himself taking care of mom and baby, and wasn't getting help from staff. Asked FOB if he had asked for assistance or mentioned that they needed anything or had concerns. LC caringly stated that staff doesn't know of needs if not expressed. FOB stated he felt like he should be on pay roll.  Encouraged FOB to please call staff for needs and concerns for assistance. Stated he would start doing that.  FOB discussed w/mom that he needed to go home for rest d/t not having any in several days excepts 30 min here and  there. Mom encouraged FOB to go home and her mom would come and sit with her. FOB asked LC  Mom would be OK. LC smiled and told FOB that she was in the right place in the hospital and would be taken care of, and if mom needed anything to please push the call bell. Mom told FOB he could go and rest. LC encouraged FOB to rest to be a strong support system for mom and baby. D/c would probably be tomorrow and he needed to be rested up. LC doesn't feel that mom is ready for d/c d/t set back of headaches and sickness w/difficulty BF.  Mom will need more assistance when she is able to focus better on BF w/o pain and nausea.   FOB stated he understood how to work SNS.  Report to RN of status, as well as LC.  Patient Name: Felicia Dawson ZOXWR'U Date: 12/03/2016 Reason for consult: Follow-up assessment   Maternal Data    Feeding Feeding Type: Formula Length of feed: 35 min  LATCH Score/Interventions                      Lactation Tools Discussed/Used Tools: Nipple Shields;Pump;Supplemental Nutrition System Nipple shield size: 20 Shell Type: Inverted   Consult Status Consult Status: Follow-up Date: 12/03/16 (in pm) Follow-up type: In-patient    Charyl Dancer 12/03/2016,  8:34 AM

## 2016-12-03 NOTE — Anesthesia Procedure Notes (Signed)
Epidural Patient location during procedure: post-op Start time: 12/03/2016 9:51 AM End time: 12/03/2016 10:06 AM  Staffing Anesthesiologist: Heather RobertsSINGER, Docie Abramovich Performed: anesthesiologist   Preanesthetic Checklist Completed: patient identified, site marked, surgical consent, pre-op evaluation, timeout performed, IV checked, risks and benefits discussed and monitors and equipment checked  Epidural Patient position: sitting Prep: DuraPrep Patient monitoring: heart rate, cardiac monitor, continuous pulse ox and blood pressure Approach: midline Location: L1-L2 Injection technique: LOR saline  Needle:  Needle type: Tuohy  Needle gauge: 17 G Needle length: 9 cm Needle insertion depth: 6 cm  Assessment Events: blood not aspirated, injection not painful, no injection resistance and negative IV test  Additional Notes Informed consent obtained prior to proceeding including risk of failure, 1% risk of PDPH, risk of minor discomfort and bruising.  Discussed rare but serious complications including epidural abscess, permanent nerve injury, epidural hematoma.  Discussed alternatives to epidural analgesia and patient desires to proceed.  Timeout performed pre-procedure verifying patient name, procedure, and platelet count.  Patient tolerated procedure well. ED space located and 20 cc of autologous blood drawn in sterile fashion injected without difficulty.  Patient returned to supine position for monitoring Reason for block:Epidural Blood Patch

## 2016-12-03 NOTE — Progress Notes (Signed)
Subjective: Postpartum Day 2: Cesarean Delivery Patient reports nausea and vomiting.  Called to evaluate pt by RN.  States pt having head and neck pain.  Worse when moves or gets up.  Pain increases causing her to vomit.  Pt had Zofran 4 hours ago but not helping any longer when patient moves.  While resting pain tolerable.  Has also had percocet.  Pt states had 2 epidural placed and multiple bolous without pain relief in labor.  Pt than had a spinal placed for her surgery.  Objective: Vital signs in last 24 hours: Temp:  [98.2 F (36.8 C)-98.4 F (36.9 C)] 98.4 F (36.9 C) (07/19 1800) Pulse Rate:  [61] 61 (07/19 1800) Resp:  [15-17] 15 (07/19 1800) BP: (118-128)/(70-71) 128/70 (07/19 1800)  Physical Exam:  General: alert, cooperative and mild distress  VSS wnl Lochia: appropriate Uterine Fundus: firm Incision covered   Recent Labs  12/01/16 0539  HGB 11.4*  HCT 33.3*    Assessment/Plan: Status post Cesarean section. Postoperative course complicated by possible spinal headache .  Discussed with pt treatment for spinal headaches.  Encourage to push fluids and have some caffeine.  Will call anesthesia and have evaluated for a blood patch.  Will order another antiemetic if continues to vomit.   Henderson Newcomerancy Jean Prothero 12/03/2016, 12:41 AM

## 2016-12-03 NOTE — Lactation Note (Signed)
This note was copied from a baby's chart. Lactation Consultation Note  Baby 2670 hours old.  Noted short mid posterior lingual frenulum contributing to tongue cupping and limited tongue protrusion Encouraged family to discuss w/ Pediatrician.  Family given resource sheets if needed. FOB has frenotomy as an infant. Noted minimal breast tissue. Mother's left nipple is inverted in center and R is evert. Having difficulty w/ flow with single SNS after trying on L side. Baby latched for a few minutes w/ #20NS. Latched w/ 5 french tubing on R side and baby took approx 18 ml. Provided volume guidelines.  Increasing per day of life. Mother has not pumped today due to spinal headache.  Encouraged her to pump both breasts this evening. Goal is to pump at least 4-6 times a day both breasts for 10-20 min.  Give any volume pumped back to baby at next feeding. Discussed how breastmilk comes to volume. Provided packet for 2 week pump rental. Encouraged hands on pumping and described cluster feeding. Encouraged family to set up OP appt.  Family will use [facility].   Patient Name: Felicia Edman CircleMian Keehan WUJWJ'XToday's Date: 12/03/2016 Reason for consult: Follow-up assessment   Maternal Data    Feeding Feeding Type: Breast Fed Length of feed: 30 min  LATCH Score/Interventions Latch: Repeated attempts needed to sustain latch, nipple held in mouth throughout feeding, stimulation needed to elicit sucking reflex. Intervention(s): Adjust position;Assist with latch;Breast massage  Audible Swallowing: A few with stimulation Intervention(s): Skin to skin;Hand expression  Type of Nipple: Flat (L inverted in center, R evert) Intervention(s): Double electric pump;Hand pump  Comfort (Breast/Nipple): Soft / non-tender     Hold (Positioning): Assistance needed to correctly position infant at breast and maintain latch.  LATCH Score: 6  Lactation Tools Discussed/Used Tools: 67F feeding tube / Syringe;Nipple  Shields Nipple shield size: 20 Breast pump type: Double-Electric Breast Pump   Consult Status Consult Status: Follow-up Date: 12/04/16 Follow-up type: In-patient    Dahlia ByesBerkelhammer, Keora Eccleston [facility] 12/03/2016, 7:39 PM

## 2016-12-03 NOTE — Anesthesia Postprocedure Evaluation (Signed)
Anesthesia Post Note  Patient: Felicia Dawson  Procedure(s) Performed: * No procedures listed *     Patient location during evaluation: Mother Baby Anesthesia Type: Epidural Level of consciousness: awake Pain management: pain level controlled Vital Signs Assessment: post-procedure vital signs reviewed and stable Respiratory status: spontaneous breathing Cardiovascular status: blood pressure returned to baseline Anesthetic complications: no Comments: Patient report significant reduction in her head/neck pain.  Probable D/C in am    Last Vitals:  Vitals:   12/03/16 1145 12/03/16 1205  BP: 138/89 126/72  Pulse: (!) 50 (!) 52  Resp: 13 18  Temp: 36.8 C 36.7 C    Last Pain:  Vitals:   12/03/16 1205  TempSrc: Oral  PainSc:    Pain Goal: Patients Stated Pain Goal: 2 (12/03/16 0922)               Heather RobertsSINGER,Jayren Cease DANIEL

## 2016-12-04 NOTE — Addendum Note (Signed)
Addendum  created 12/04/16 1537 by Heather RobertsSinger, Ocie Stanzione, MD   Sign clinical note

## 2016-12-04 NOTE — Discharge Summary (Signed)
Obstetric Discharge Summary Reason for Admission: induction of labor Prenatal Procedures: none Intrapartum Procedures: spontaneous vaginal delivery and cesarean: low cervical, transverse Postpartum Procedures: blood patch Complications-Operative and Postpartum: spinal headache  Delivery summary not available  Hospital Course:  Active Problems:   Post-dates pregnancy   Felicia Dawson is a 36 y.o. G1P0 s/p LTCS.  Patient was admitted 11/29/16  She has postpartum course that was complicated by spinal headache/blood patch. The pt feels ready to go home and  will be discharged with outpatient follow-up.   Today: No acute events overnight.  Pt denies problems with ambulating, voiding or po intake.  She denies nausea or vomiting.  Pain is moderately controlled.   Lochia Small.  Plan for birth control is undecided.  Method of Feeding: breast  Physical Exam:  BP 114/80 (BP Location: Right Arm)   Pulse (!) 57   Temp 98.5 F (36.9 C) (Oral)   Resp 16   Ht 5\' 7"  (1.702 m)   Wt 173 lb (78.5 kg)   LMP 02/19/2016   SpO2 99%   BMI 27.10 kg/m  General: alert and cooperative Lochia: appropriate Uterine Fundus: firm Incision: CDI DVT Evaluation: No evidence of DVT seen on physical exam.  H/H: Lab Results  Component Value Date/Time   HGB 11.4 (L) 12/01/2016 05:39 AM   HCT 33.3 (L) 12/01/2016 05:39 AM    Discharge Diagnoses: Term Pregnancy-delivered  Discharge Information: Date: 12/04/2016 Activity: pelvic rest Diet: routine  Medications: PNV and Ibuprofen Breast feeding:  Yes Condition: stable Instructions: refer to handout Discharge to: home   Discharge Instructions     Remove dressing in 72 hours    Complete by:  As directed    Activity as tolerated    Complete by:  As directed    Activity as tolerated    Complete by:  As directed    Call MD for:    Complete by:  As directed    Call MD for:  difficulty breathing, headache or visual disturbances    Complete by:  As directed     Call MD for:  difficulty breathing, headache or visual disturbances    Complete by:  As directed    Call MD for:  extreme fatigue    Complete by:  As directed    Call MD for:  hives    Complete by:  As directed    Call MD for:  persistant dizziness or light-headedness    Complete by:  As directed    Call MD for:  persistant nausea and vomiting    Complete by:  As directed    Call MD for:  persistant nausea and vomiting    Complete by:  As directed    Call MD for:  redness, tenderness, or signs of infection (pain, swelling, redness, odor or green/yellow discharge around incision site)    Complete by:  As directed    Call MD for:  redness, tenderness, or signs of infection (pain, swelling, redness, odor or green/yellow discharge around incision site)    Complete by:  As directed    Call MD for:  severe uncontrolled pain    Complete by:  As directed    Call MD for:  severe uncontrolled pain    Complete by:  As directed    Call MD for:  temperature >100.4    Complete by:  As directed    Call MD for:  temperature >100.4    Complete by:  As directed    Diet -  low sodium heart healthy    Complete by:  As directed    Diet - low sodium heart healthy    Complete by:  As directed    Discharge instructions    Complete by:  As directed    routine   Driving restriction     Complete by:  As directed    Avoid driving for at least 2 weeks.   Driving restriction     Complete by:  As directed    Avoid driving for at least 2 weeks.   Lifting restrictions    Complete by:  As directed    Weight restriction of 10 lbs.   Lifting restrictions    Complete by:  As directed    Weight restriction of 10 lbs.   Sexual acrtivity    Complete by:  As directed    Avoid sex for 6 weeks   Sexual acrtivity    Complete by:  As directed    6 weeks     Allergies as of 12/04/2016   Not on File     Medication List    TAKE these medications   cholecalciferol 1000 units tablet Commonly known as:   VITAMIN D Take 1,000 Units by mouth daily.   ibuprofen 600 MG tablet Commonly known as:  ADVIL,MOTRIN Take 1 tablet (600 mg total) by mouth every 6 (six) hours as needed.   oxyCODONE 5 MG immediate release tablet Commonly known as:  Oxy IR/ROXICODONE Take 1 tablet (5 mg total) by mouth every 4 (four) hours as needed (pain scale 4-7).   prenatal multivitamin Tabs tablet Take 1 tablet by mouth daily at 12 noon.      Follow-up Information    Gynecology, [facility] Schedule an appointment as soon as possible for a visit in 2 week(s).   Specialty:  Obstetrics and Gynecology Contact information: [address] [place] [address] [phone]            Rhea PinkLori A Jamicheal Heard, CNM 12/04/2016,10:18 AM

## 2016-12-04 NOTE — Discharge Instructions (Signed)
Postpartum Depression and Baby Blues The postpartum period begins right after the birth of a baby. During this time, there is often a great amount of joy and excitement. It is also a time of many changes in the life of the parents. Regardless of how many times a mother gives birth, each child brings new challenges and dynamics to the family. It is not unusual to have feelings of excitement along with confusing shifts in moods, emotions, and thoughts. All mothers are at risk of developing postpartum depression or the "baby blues." These mood changes can occur right after giving birth, or they may occur many months after giving birth. The baby blues or postpartum depression can be mild or severe. Additionally, postpartum depression can go away rather quickly, or it can be a long-term condition. What are the causes? Raised hormone levels and the rapid drop in those levels are thought to be a main cause of postpartum depression and the baby blues. A number of hormones change during and after pregnancy. Estrogen and progesterone usually decrease right after the delivery of your baby. The levels of thyroid hormone and various cortisol steroids also rapidly drop. Other factors that play a role in these mood changes include major life events and genetics. What increases the risk? If you have any of the following risks for the baby blues or postpartum depression, know what symptoms to watch out for during the postpartum period. Risk factors that may increase the likelihood of getting the baby blues or postpartum depression include:  Having a personal or family history of depression.  Having depression while being pregnant.  Having premenstrual mood issues or mood issues related to oral contraceptives.  Having a lot of life stress.  Having marital conflict.  Lacking a social support network.  Having a baby with special needs.  Having health problems, such as diabetes.  What are the signs or  symptoms? Symptoms of baby blues include:  Brief changes in mood, such as going from extreme happiness to sadness.  Decreased concentration.  Difficulty sleeping.  Crying spells, tearfulness.  Irritability.  Anxiety.  Symptoms of postpartum depression typically begin within the first month after giving birth. These symptoms include:  Difficulty sleeping or excessive sleepiness.  Marked weight loss.  Agitation.  Feelings of worthlessness.  Lack of interest in activity or food.  Postpartum psychosis is a very serious condition and can be dangerous. Fortunately, it is rare. Displaying any of the following symptoms is cause for immediate medical attention. Symptoms of postpartum psychosis include:  Hallucinations and delusions.  Bizarre or disorganized behavior.  Confusion or disorientation.  How is this diagnosed? A diagnosis is made by an evaluation of your symptoms. There are no medical or lab tests that lead to a diagnosis, but there are various questionnaires that a health care provider may use to identify those with the baby blues, postpartum depression, or psychosis. Often, a screening tool called the [place] Postnatal Depression Scale is used to diagnose depression in the postpartum period. How is this treated? The baby blues usually goes away on its own in 1-2 weeks. Social support is often all that is needed. You will be encouraged to get adequate sleep and rest. Occasionally, you may be given medicines to help you sleep. Postpartum depression requires treatment because it can last several months or longer if it is not treated. Treatment may include individual or group therapy, medicine, or both to address any social, physiological, and psychological factors that may play a role in the  depression. Regular exercise, a healthy diet, rest, and social support may also be strongly recommended. Postpartum psychosis is more serious and needs treatment right away.  Hospitalization is often needed. Follow these instructions at home:  Get as much rest as you can. Nap when the baby sleeps.  Exercise regularly. Some women find yoga and walking to be beneficial.  Eat a balanced and nourishing diet.  Do little things that you enjoy. Have a cup of tea, take a bubble bath, read your favorite magazine, or listen to your favorite music.  Avoid alcohol.  Ask for help with household chores, cooking, grocery shopping, or running errands as needed. Do not try to do everything.  Talk to people close to you about how you are feeling. Get support from your partner, family members, friends, or other new moms.  Try to stay positive in how you think. Think about the things you are grateful for.  Do not spend a lot of time alone.  Only take over-the-counter or prescription medicine as directed by your health care provider.  Keep all your postpartum appointments.  Let your health care provider know if you have any concerns. Contact a health care provider if: You are having a reaction to or problems with your medicine. Get help right away if:  You have suicidal feelings.  You think you may harm the baby or someone else. This information is not intended to replace advice given to you by your health care provider. Make sure you discuss any questions you have with your health care provider. Document Released: 02/05/2004 Document Revised: 10/09/2015 Document Reviewed: 02/12/2013 Elsevier Interactive Patient Education  2017 Else[employer]. Epidural Blood Patch for Spinal Headache, Care After Refer to this sheet in the next few weeks. These instructions provide you with information about caring for yourself after your procedure. Your health care provider may also give you more specific instructions. Your treatment has been planned according to current medical practices, but problems sometimes occur. Call your health care provider if you have any problems or questions  after your procedure. What can I expect after the procedure? After the procedure, it is common to have:  Almost-instant relief from your headache, or have slowly easing pain over the next 24 hours.  Mild backaches. These may last for a few days.  A mild feeling of prickly or tingly skin.  Neck pain.  Pain down the arm or leg (radiating pain).  Follow these instructions at home:  For the first 24 hours after the procedure: ? Do not drive if you received a medicine to help you relax (sedative). ? Rest in bed as much as you can.  For 2-3 weeks, do not lift anything that is heavier than 10 lb (4.5 kg).  When picking up items from a low position, squat rather than bend.  Avoid too much straining. This can cause the patch to move out of position and cause the headache to return.  Drink enough fluids to keep your urine clear or pale yellow.  Take over-the-counter and prescription medicines only as told by your health care provider.  Keep all follow-up visits as told by your health care provider. This is important. Contact a health care provider if:  You have a fever.  You have back pain.  You have pain that goes down your legs.  Your headache comes back.  You have trouble with your bowels or bladder. Get help right away if:  You have a very bad headache.  You have nausea  or vomiting. This information is not intended to replace advice given to you by your health care provider. Make sure you discuss any questions you have with your health care provider. Document Released: 02/21/2013 Document Revised: 10/09/2015 Document Reviewed: 07/31/2015 Elsevier Interactive Patient Education  [employer]2018 [employer] Inc.

## 2016-12-04 NOTE — Anesthesia Post-op Follow-up Note (Signed)
  Anesthesia Pain Follow-up Note  Patient: Felicia Dawson  Day #: 1  Date of Follow-up: 12/04/2016 Time: 3:35 PM  Last Vitals:  Vitals:   12/03/16 1925 12/04/16 0851  BP: 128/78 114/80  Pulse:  (!) 57  Resp: 18 16  Temp: 36.6 C 36.9 C    Level of Consciousness: alert  Pain: mild   Side Effects:None  Catheter Site Exam:clean, dry, no drainage     Plan: Significant improvement with minimal headache.  D/C today.  Pt told to avoid straining and lifting for the next 24 hours with gradual increase after 24 hour, for continued healing.  Milburn Freeney DANIEL

## 2016-12-04 NOTE — Plan of Care (Signed)
Problem: Education: Goal: Knowledge of condition will improve Discharge education reviewed with patient.

## 2016-12-04 NOTE — Lactation Note (Signed)
This note was copied from a baby's chart. Lactation Consultation Note: Mother reports that she is concerned that her breast are not filling full and she is unable to hand express colostrum. Mother taught breast massage and hand expression. Observed multiple drops of colostrum on her nipple.,  Mother advised to continue to cue base feed infant and to continue to post pump at least 4-6 times daily for 15-20 mins after each breast feeding. Mother rented a [employer] electric pump. Mother advised to phone her insurance company on Monday to have her pump shipped. Mother is using a #20 nipple shield on the left breast. She has a inverted nipple. Nipple firms well but goes back inside and softens . Infant latches well to the Rt breast. Observed feeding for several mins. Taught parents to listen and watch for swallows. Mother has a 2-3 finger span between her breast but appears to have sufficient glandular tissue. Advised mother to do good breast massage and hand express frequently. Mother was advised to massage breast well and use ice as needed to prevent severe engorgement.  Mother was scheduled for a  Lactation out patient appt. On July 24 at 2p,m.   Patient Name: Felicia Dawson'UToday's Date: 12/04/2016 Reason for consult: Follow-up assessment   Maternal Data    Feeding Feeding Type: Breast Fed  LATCH Score/Interventions Latch: Grasps breast easily, tongue down, lips flanged, rhythmical sucking. Intervention(s): Adjust position;Assist with latch;Breast massage;Breast compression  Audible Swallowing: A few with stimulation Intervention(s): Hand expression  Type of Nipple: Everted at rest and after stimulation Intervention(s): Hand pump;Double electric pump  Comfort (Breast/Nipple): Soft / non-tender     Hold (Positioning): No assistance needed to correctly position infant at breast. Intervention(s): Support Pillows;Position options  LATCH Score: 9  Lactation Tools Discussed/Used      Consult Status Consult Status: Follow-up Date: 12/07/16 Follow-up type: Out-patient    Stevan BornKendrick, Telsa Dillavou [facility] 12/04/2016, 11:13 AM

## 2016-12-09 ENCOUNTER — Ambulatory Visit: Payer: Self-pay

## 2016-12-09 NOTE — Lactation Note (Signed)
This note was copied from a baby's chart. Lactation Consult  Mother's reason for visit:  Mom rented a Symphony and is missing parts, help with manual expression, worried about supply Visit Type:  OP  Consult:  Follow-Up Lactation Consultant:  Rulon EisenmengerElizabeth E Chloe Dawson  ________________________________________________________________________ Birth wt was 8lb1oz Lowest wt on 12/02/16 was 7lb7oz [facility] was 7lb 13oz Wt on 12/09/16 pre feed 8lb5oz   ________________________________________________________________________  Mother's Name: Felicia Dawson Type of delivery:  C-Section, Low Transverse Breastfeeding Experience:  none Maternal Medical Conditions:  none Maternal Medications:  Prenatal vitamin, vitamin D3, fenugreek 1 capsule 3x/24hr   ________________________________________________________________________  Breastfeeding History (Post Discharge)  Frequency of breastfeeding:  6x/24hr Duration of feeding:  30 minutes total, baby only latches to the R side, mom only pumps the L side  Supplementation  Formula:  Volume 2oz  Frequency:  8x/24hr Total volume per day: 16oz       Brand: Similac  Breastmilk:  Volume unsure Frequency:  6x/24hr Total volume per day: unsure   Method:  Bottle,   Pumping  Type of pump:  Manual Frequency:  4x/24hr Volume:  5ml total   Infant Intake and Output Assessment  Voids:  8-10 in 24 hrs.  Color:  Clear yellow Stools:  3 in 24 hrs.  Color:  Yellow  ________________________________________________________________________  Maternal Breast Assessment  Breast:  soft, appears to have normal breast tissue Nipple:  Inverted on the L, evert in the R  Pain level:  0 Pain interventions:  Cream/oil, Earth Momma   _______________________________________________________________________ Feeding Assessment/Evaluation  Initial feeding assessment:  Infant's oral assessment:  normal  Positioning:  Cross cradle Left breast  Attached  assessment:  Deep  Lips flanged:  Yes.    Lips untucked:  Yes.    Suck assessment:  Displays both  Tools:  Nipple shield 20 mm Instructed on use and cleaning of tool:  Yes.    Pre-feed weight:  3770 g  (8 lb. 5.0 oz.) Post-feed weight:  3772 g  Amount transferred:   2ml Feeding lasted 20 minutes  Right breast in cross cradle, no NS but used the 5 Fr Pre-feed weight:  3772 g  (8 lb. 5.0 oz.) Post-feed weight:  3816 g  Amount transferred:  20 ml Amount supplemented:  24 ml Feeding lasted 20 minutes  Baby finished feeding with a Dr. Irving BurtonBrowns slow flow wide mouth bottle. He took 16 ml  Mom needs support and encouragement when latching baby. Dad is worried that when he goes back to work mom will not be able to bf alone. Mom's L nipple is inverted and the R nipple is everted and long. Mom denies breast or nipple pain. She is worried about supply. [facility] because she did not have the diaphragms for the Symphony. Given parts today. Mom has only been pumping the L side 4 times daily. Baby can latch to the L breast for 4-5 sucks then slips off. Applied #20 NS and baby was able to maintain long bursts of sucking. Parents stated they have a NS at home but "it does not work like it did today". Reviewed application and care of the NS. Tried the 5 Fr at the R breast today. Parents started to struggle after 24 ml and Dad wanted to learn paced bottle feeding. Mom took baby off the breast and dad finished the feeding with a bottle. Mom will offer the breast on demand 8+/24hr, post pump, and supplement. Told mom that  she can keep the DEBP an extra week since she did not have the correct parts.    Total amount pumped post feed:  R   20ml    L 2 ml  Total amount transferred:  22 ml Total supplement given:  40 ml

## 2017-03-21 ENCOUNTER — Encounter (HOSPITAL_COMMUNITY): Payer: Self-pay

## 2017-11-29 ENCOUNTER — Other Ambulatory Visit: Payer: Self-pay | Admitting: Family Medicine

## 2017-11-29 DIAGNOSIS — N6320 Unspecified lump in the left breast, unspecified quadrant: Secondary | ICD-10-CM

## 2017-12-02 ENCOUNTER — Other Ambulatory Visit: Payer: Self-pay | Admitting: Family Medicine

## 2017-12-02 ENCOUNTER — Ambulatory Visit
Admission: RE | Admit: 2017-12-02 | Discharge: 2017-12-02 | Disposition: A | Discharge status: Home / Self Care | Payer: BLUE CROSS/BLUE SHIELD | Source: Ambulatory Visit | Attending: Family Medicine | Admitting: Family Medicine

## 2017-12-02 DIAGNOSIS — L0291 Cutaneous abscess, unspecified: Secondary | ICD-10-CM

## 2017-12-02 DIAGNOSIS — N6320 Unspecified lump in the left breast, unspecified quadrant: Secondary | ICD-10-CM

## 2017-12-07 ENCOUNTER — Other Ambulatory Visit: Payer: BLUE CROSS/BLUE SHIELD

## 2017-12-08 LAB — AEROBIC/ANAEROBIC CULTURE W GRAM STAIN (SURGICAL/DEEP WOUND): Special Requests: NORMAL

## 2017-12-08 LAB — AEROBIC/ANAEROBIC CULTURE (SURGICAL/DEEP WOUND)

## 2017-12-14 ENCOUNTER — Other Ambulatory Visit: Payer: BLUE CROSS/BLUE SHIELD

## 2017-12-22 ENCOUNTER — Other Ambulatory Visit: Payer: Self-pay | Admitting: Family Medicine

## 2017-12-22 ENCOUNTER — Ambulatory Visit
Admission: RE | Admit: 2017-12-22 | Discharge: 2017-12-22 | Disposition: A | Discharge status: Home / Self Care | Payer: BLUE CROSS/BLUE SHIELD | Source: Ambulatory Visit | Attending: Family Medicine | Admitting: Family Medicine

## 2017-12-22 ENCOUNTER — Ambulatory Visit: Payer: BLUE CROSS/BLUE SHIELD

## 2017-12-22 DIAGNOSIS — L0291 Cutaneous abscess, unspecified: Secondary | ICD-10-CM

## 2018-02-02 ENCOUNTER — Ambulatory Visit
Admission: RE | Admit: 2018-02-02 | Discharge: 2018-02-02 | Disposition: A | Discharge status: Home / Self Care | Payer: BLUE CROSS/BLUE SHIELD | Source: Ambulatory Visit | Attending: Family Medicine | Admitting: Family Medicine

## 2018-02-02 ENCOUNTER — Other Ambulatory Visit: Payer: Self-pay | Admitting: Family Medicine

## 2018-02-02 DIAGNOSIS — L0291 Cutaneous abscess, unspecified: Secondary | ICD-10-CM

## 2018-02-16 ENCOUNTER — Other Ambulatory Visit: Payer: BLUE CROSS/BLUE SHIELD

## 2018-03-02 ENCOUNTER — Other Ambulatory Visit: Payer: BLUE CROSS/BLUE SHIELD

## 2022-02-18 ENCOUNTER — Other Ambulatory Visit: Payer: Self-pay | Admitting: Family Medicine

## 2022-02-18 DIAGNOSIS — Z1231 Encounter for screening mammogram for malignant neoplasm of breast: Secondary | ICD-10-CM

## 2022-04-21 ENCOUNTER — Other Ambulatory Visit: Payer: Self-pay | Admitting: Family Medicine

## 2022-04-21 DIAGNOSIS — N63 Unspecified lump in unspecified breast: Secondary | ICD-10-CM
